# Patient Record
Sex: Female | Born: 1960 | Race: White | Hispanic: No | Marital: Married | Smoking: Never smoker
Health system: Southern US, Community
[De-identification: ages and names within clinical notes are randomized; demographics above are authoritative.]

## PROBLEM LIST (undated history)

## (undated) DIAGNOSIS — Z1211 Encounter for screening for malignant neoplasm of colon: Secondary | ICD-10-CM

## (undated) DIAGNOSIS — K635 Polyp of colon: Secondary | ICD-10-CM

## (undated) DIAGNOSIS — Z973 Presence of spectacles and contact lenses: Secondary | ICD-10-CM

## (undated) DIAGNOSIS — T8859XA Other complications of anesthesia, initial encounter: Secondary | ICD-10-CM

## (undated) DIAGNOSIS — R079 Chest pain, unspecified: Secondary | ICD-10-CM

## (undated) DIAGNOSIS — T4145XA Adverse effect of unspecified anesthetic, initial encounter: Secondary | ICD-10-CM

## (undated) DIAGNOSIS — T753XXA Motion sickness, initial encounter: Secondary | ICD-10-CM

## (undated) DIAGNOSIS — M419 Scoliosis, unspecified: Secondary | ICD-10-CM

## (undated) HISTORY — DX: Polyp of colon: K63.5

## (undated) HISTORY — PX: ADENOIDECTOMY: SUR15

## (undated) HISTORY — DX: Chest pain, unspecified: R07.9

## (undated) HISTORY — DX: Encounter for screening for malignant neoplasm of colon: Z12.11

## (undated) HISTORY — PX: SHOULDER ARTHROSCOPY: SHX128

## (undated) HISTORY — PX: BREAST BIOPSY: SHX20

---
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
## 2014-12-09 LAB — HM PAP SMEAR: HM PAP: NEGATIVE

## 2015-01-14 LAB — TSH: TSH: 3.23 (ref <=5.90)

## 2015-01-14 LAB — CBC AND DIFFERENTIAL
Hemoglobin: 15.4 (ref 12.0–16.0)
Platelets: 289 (ref 150–399)
WBC: 7.3

## 2015-04-22 ENCOUNTER — Other Ambulatory Visit: Payer: Self-pay | Admitting: Orthopedic Surgery

## 2015-04-22 DIAGNOSIS — M25512 Pain in left shoulder: Secondary | ICD-10-CM

## 2015-05-01 ENCOUNTER — Ambulatory Visit
Admission: RE | Admit: 2015-05-01 | Discharge: 2015-05-01 | Disposition: A | Discharge status: Home / Self Care | Payer: BLUE CROSS/BLUE SHIELD | Source: Ambulatory Visit | Attending: Orthopedic Surgery | Admitting: Orthopedic Surgery

## 2015-05-01 DIAGNOSIS — M25512 Pain in left shoulder: Secondary | ICD-10-CM

## 2016-09-01 ENCOUNTER — Emergency Department: Payer: BC Managed Care – PPO

## 2016-09-01 ENCOUNTER — Emergency Department
Admission: EM | Admit: 2016-09-01 | Discharge: 2016-09-01 | Disposition: A | Discharge status: Home / Self Care | Payer: BC Managed Care – PPO | Attending: Emergency Medicine | Admitting: Emergency Medicine

## 2016-09-01 DIAGNOSIS — R079 Chest pain, unspecified: Secondary | ICD-10-CM

## 2016-09-01 DIAGNOSIS — R112 Nausea with vomiting, unspecified: Secondary | ICD-10-CM | POA: Insufficient documentation

## 2016-09-01 DIAGNOSIS — R0789 Other chest pain: Secondary | ICD-10-CM | POA: Insufficient documentation

## 2016-09-01 LAB — CBC
HEMATOCRIT: 42.9 % (ref 35.0–47.0)
HEMOGLOBIN: 14.6 g/dL (ref 12.0–16.0)
MCH: 30.5 pg (ref 26.0–34.0)
MCHC: 34.1 g/dL (ref 32.0–36.0)
MCV: 89.5 fL (ref 80.0–100.0)
Platelets: 281 10*3/uL (ref 150–440)
RBC: 4.79 MIL/uL (ref 3.80–5.20)
RDW: 12.6 % (ref 11.5–14.5)
WBC: 7.3 10*3/uL (ref 3.6–11.0)

## 2016-09-01 LAB — BASIC METABOLIC PANEL
ANION GAP: 5 (ref 5–15)
BUN: 11 mg/dL (ref 6–20)
CALCIUM: 9.6 mg/dL (ref 8.9–10.3)
CHLORIDE: 107 mmol/L (ref 101–111)
CO2: 29 mmol/L (ref 22–32)
Creatinine, Ser: 0.65 mg/dL (ref 0.44–1.00)
GFR calc non Af Amer: >60 mL/min (ref >60)
Glucose, Bld: 98 mg/dL (ref 65–99)
POTASSIUM: 4 mmol/L (ref 3.5–5.1)
Sodium: 141 mmol/L (ref 135–145)

## 2016-09-01 LAB — TROPONIN I

## 2016-09-01 NOTE — ED Triage Notes (Signed)
Pt reports to ED w/ c/o 2 episodes of CP.  Pt reports 1st one yesterday, had episode of emesis associated w/ CP.  Pt A/Ox4, denies SOB or CP at this time.  Pt able to move all limbs w/o issue.  Pt denies fevers or LOC.  Pt sts that episode this AM.

## 2016-09-01 NOTE — ED Provider Notes (Signed)
[facility] Emergency Department Provider Note  Time seen: 2:32 PM  I have reviewed the triage vital signs and the nursing notes.   HISTORY  Chief Complaint Chest Pain    HPI Anna Hammond is a 56 y.o. female with no past medical history who presents to the emergency department with chest discomfort. According to the patient yesterday she was feeling somewhat nauseated and felt some discomfort which she describes as a tightness feeling in the left side of her chest had one episode of vomiting. She states after vomiting she felt much better. She had been symptom-free until this morning where she once again had a tightness sensation to left chest denies any nausea or shortness of breath or diaphoresis with this episode. 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 Her husband has a cardiac history and convinced her to come to the emergency department evaluated. Patient denies any symptoms at this time. Denies any chest discomfort shortness of breath nausea or diaphoresis. Denies any abdominal pain or diarrhea at any time. Overall the patient appears very well, no distress.  History reviewed. No pertinent past medical history.  There are no active problems to display for this patient.   History reviewed. No pertinent surgical history.  Prior to Admission medications   Not on File    Allergies  Allergen Reactions  . Macrobid [Nitrofurantoin Macrocrystal] Nausea Only    No family history on file.  Social History Social History  Substance Use Topics  . Smoking status: Never Smoker  . Smokeless tobacco: Never Used  . Alcohol use No    Review of Systems Constitutional: Negative for fever. Cardiovascular: Chest discomfort last night and this morning. Respiratory: Negative for shortness of breath. Gastrointestinal: Negative for abdominal pain. One episode of vomiting last night. Neurological:  Negative for headache 10-point ROS otherwise negative.  ____________________________________________   PHYSICAL EXAM:  VITAL SIGNS: ED Triage Vitals  Enc Vitals Group     BP 09/01/16 1141 132/77     Pulse Rate 09/01/16 1141 66     Resp 09/01/16 1141 18     Temp 09/01/16 1141 98.2 F (36.8 C)     Temp Source 09/01/16 1141 Oral     SpO2 09/01/16 1141 100 %     Weight 09/01/16 1141 125 lb (56.7 kg)     Height 09/01/16 1141 5\' 5"  (1.651 m)     Head Circumference --      Peak Flow --      Pain Score 09/01/16 1142 0     Pain Loc --      Pain Edu? --      Excl. in [place]? --     Constitutional: Alert and oriented. Well appearing and in no distress. Eyes: Normal exam ENT   Head: Normocephalic and atraumatic.   Mouth/Throat: Mucous membranes are moist. Cardiovascular: Normal rate, regular rhythm. No murmur Respiratory: Normal respiratory effort without tachypnea nor retractions. Breath sounds are clear Gastrointestinal: Soft and nontender. No distention.   Musculoskeletal: Nontender with normal range of motion in all extremities. No lower extremity tenderness or edema. Neurologic:  Normal speech and language. No gross focal neurologic deficits  Skin:  Skin is warm, dry and intact.  Psychiatric: Mood and affect are normal.   ____________________________________________    EKG  EKG reviewed and interpreted by myself shows normal sinus rhythm at 62 bpm, narrow QRS, normal axis, normal intervals, no ST  changes. Reassuring EKG.  ____________________________________________    RADIOLOGY  Chest x-ray negative  ____________________________________________   INITIAL IMPRESSION / ASSESSMENT AND PLAN / ED COURSE  Pertinent labs & imaging results that were available during my care of the patient were reviewed by me and considered in my medical decision making (see chart for details).  Patient presents to department with chest tightness/discomfort which has occurred twice.  Denies any symptoms at this time. Currently the patient EKG appears very normal, labs are normal including negative troponin. Chest x-ray is negative. Patient denies any personal or family cardiac history. I discussed with the patient a negative work up being very reassuring however she would still need a stress test. Patient is agreeable and will go to her husband's cardiologist to arrange this stress test. Provided by normal chest pain return precautions.  ____________________________________________   FINAL CLINICAL IMPRESSION(S) / ED DIAGNOSES  Chest pain    Harvest Dark, MD 09/01/16 1435

## 2016-09-01 NOTE — Discharge Instructions (Signed)
You have been seen in the emergency department today for chest pain. Your workup has shown normal results. As we discussed please follow-up with your primary care physician in the next 1-2 days for recheck. Return to the emergency department for any further chest pain, trouble breathing, or any other symptom personally concerning to yourself.  Please call the number provided for cardiology or see your husband's cardiologist to arrange a stress test as soon as possible.

## 2017-10-04 ENCOUNTER — Encounter: Payer: Self-pay | Admitting: Family Medicine

## 2017-10-18 ENCOUNTER — Ambulatory Visit: Payer: BC Managed Care – PPO | Admitting: Family Medicine

## 2017-10-18 ENCOUNTER — Encounter: Payer: Self-pay | Admitting: Family Medicine

## 2017-10-18 ENCOUNTER — Encounter: Payer: Self-pay | Admitting: Emergency Medicine

## 2017-10-18 VITALS — BP 116/78 | HR 72 | Temp 97.8°F | Resp 14 | Ht 65.0 in | Wt 128.0 lb

## 2017-10-18 DIAGNOSIS — Z114 Encounter for screening for human immunodeficiency virus [HIV]: Secondary | ICD-10-CM

## 2017-10-18 DIAGNOSIS — Z1159 Encounter for screening for other viral diseases: Secondary | ICD-10-CM | POA: Diagnosis not present

## 2017-10-18 DIAGNOSIS — Z0000 Encounter for general adult medical examination without abnormal findings: Secondary | ICD-10-CM | POA: Diagnosis not present

## 2017-10-18 DIAGNOSIS — Z1231 Encounter for screening mammogram for malignant neoplasm of breast: Secondary | ICD-10-CM | POA: Diagnosis not present

## 2017-10-18 DIAGNOSIS — Z1211 Encounter for screening for malignant neoplasm of colon: Secondary | ICD-10-CM

## 2017-10-18 DIAGNOSIS — Z1239 Encounter for other screening for malignant neoplasm of breast: Secondary | ICD-10-CM

## 2017-10-18 NOTE — Patient Instructions (Signed)
Preventive Care 40-64 Years, Female Preventive care refers to lifestyle choices and visits with your health care provider that can promote health and wellness. What does preventive care include?  A yearly physical exam. This is also called an annual well check.  Dental exams once or twice a year.  Routine eye exams. Ask your health care provider how often you should have your eyes checked.  Personal lifestyle choices, including: ? Daily care of your teeth and gums. ? Regular physical activity. ? Eating a healthy diet. ? Avoiding tobacco and drug use. ? Limiting alcohol use. ? Practicing safe sex. ? Taking low-dose aspirin daily starting at age 58. ? Taking vitamin and mineral supplements as recommended by your health care provider. What happens during an annual well check? The services and screenings done by your health care provider during your annual well check will depend on your age, overall health, lifestyle risk factors, and family history of disease. Counseling Your health care provider may ask you questions about your:  Alcohol use.  Tobacco use.  Drug use.  Emotional well-being.  Home and relationship well-being.  Sexual activity.  Eating habits.  Work and work Statistician.  Method of birth control.  Menstrual cycle.  Pregnancy history.  Screening You may have the following tests or measurements:  Height, weight, and BMI.  Blood pressure.  Lipid and cholesterol levels. These may be checked every 5 years, or more frequently if you are over 81 years old.  Skin check.  Lung cancer screening. You may have this screening every year starting at age 78 if you have a 30-pack-year history of smoking and currently smoke or have quit within the past 15 years.  Fecal occult blood test (FOBT) of the stool. You may have this test every year starting at age 65.  Flexible sigmoidoscopy or colonoscopy. You may have a sigmoidoscopy every 5 years or a colonoscopy  every 10 years starting at age 30.  Hepatitis C blood test.  Hepatitis B blood test.  Sexually transmitted disease (STD) testing.  Diabetes screening. This is done by checking your blood sugar (glucose) after you have not eaten for a while (fasting). You may have this done every 1-3 years.  Mammogram. This may be done every 1-2 years. Talk to your health care provider about when you should start having regular mammograms. This may depend on whether you have a family history of breast cancer.  BRCA-related cancer screening. This may be done if you have a family history of breast, ovarian, tubal, or peritoneal cancers.  Pelvic exam and Pap test. This may be done every 3 years starting at age 80. Starting at age 36, this may be done every 5 years if you have a Pap test in combination with an HPV test.  Bone density scan. This is done to screen for osteoporosis. You may have this scan if you are at high risk for osteoporosis.  Discuss your test results, treatment options, and if necessary, the need for more tests with your health care provider. Vaccines Your health care provider may recommend certain vaccines, such as:  Influenza vaccine. This is recommended every year.  Tetanus, diphtheria, and acellular pertussis (Tdap, Td) vaccine. You may need a Td booster every 10 years.  Varicella vaccine. You may need this if you have not been vaccinated.  Zoster vaccine. You may need this after age 5.  Measles, mumps, and rubella (MMR) vaccine. You may need at least one dose of MMR if you were born in  1957 or later. You may also need a second dose.  Pneumococcal 13-valent conjugate (PCV13) vaccine. You may need this if you have certain conditions and were not previously vaccinated.  Pneumococcal polysaccharide (PPSV23) vaccine. You may need one or two doses if you smoke cigarettes or if you have certain conditions.  Meningococcal vaccine. You may need this if you have certain  conditions.  Hepatitis A vaccine. You may need this if you have certain conditions or if you travel or work in places where you may be exposed to hepatitis A.  Hepatitis B vaccine. You may need this if you have certain conditions or if you travel or work in places where you may be exposed to hepatitis B.  Haemophilus influenzae type b (Hib) vaccine. You may need this if you have certain conditions.  Talk to your health care provider about which screenings and vaccines you need and how often you need them. This information is not intended to replace advice given to you by your health care provider. Make sure you discuss any questions you have with your health care provider. Document Released: 08/12/2015 Document Revised: 04/04/2016 Document Reviewed: 05/17/2015 Elsevier Interactive Patient Education  2018 Elsevier Inc.  

## 2017-10-18 NOTE — Progress Notes (Signed)
Patient: Anna Hammond Female    DOB: 18-Jun-1961   57 y.o.   MRN: [id number] Visit Date: 10/18/2017  Today's Provider: Lavon Paganini, MD   Chief Complaint  Patient presents with  . New Patient (Initial Visit)   Subjective:    HPI Pt is establishing care today. She reports that she is feeling well. Does not have any complaints. She was seeing her GYN he recently retired so she wanted to get a Primary care doctor. She reports that the last time she was seen by her previous doctor was about a year ago.  Pt is health and does not have any health problems.   Last pap was 12/16/14 - Reactive Cellular Changes associated with Inflammation, HPV negative Last mammogram 03/25/15 BIRADS 2  Per pt colonoscopy was about 10 years ago. No record of this in care everywhere.      Allergies  Allergen Reactions  . Macrobid [Nitrofurantoin Macrocrystal] Nausea Only  . Nitrofurantoin Nausea Only and Nausea And Vomiting    Current Outpatient Medications:  Marland Kitchen  Multiple Vitamin (MULTI-VITAMINS) TABS, Take by mouth., Disp: , Rfl:   Review of Systems  Constitutional: Negative.   HENT: Negative.   Eyes: Negative.   Respiratory: Negative.   Cardiovascular: Negative.   Gastrointestinal: Negative.   Endocrine: Negative.   Genitourinary: Negative.   Musculoskeletal: Negative.   Skin: Negative.   Allergic/Immunologic: Negative.   Neurological: Negative.   Hematological: Negative.   Psychiatric/Behavioral: Negative.    History reviewed. No pertinent past medical history.  Past Surgical History:  Procedure Laterality Date  . ADENOIDECTOMY    . SHOULDER ARTHROSCOPY Right     Social History   Tobacco Use  . Smoking status: Never Smoker  . Smokeless tobacco: Never Used  Substance Use Topics  . Alcohol use: Yes    Comment: once a month   Objective:   BP 116/78 (BP Location: Left Arm, Patient Position: Sitting)   Pulse 72   Temp 97.8 F (36.6 C) (Oral)   Resp 14   Ht 5\' 5"   (1.651 m)   Wt 128 lb (58.1 kg)   BMI 21.30 kg/m  Vitals:   10/18/17 1559  BP: 116/78  Pulse: 72  Resp: 14  Temp: 97.8 F (36.6 C)  TempSrc: Oral  Weight: 128 lb (58.1 kg)  Height: 5\' 5"  (1.651 m)     Physical Exam  Constitutional: She is oriented to person, place, and time. She appears well-developed and well-nourished. No distress.  HENT:  Head: Normocephalic and atraumatic.  Right Ear: External ear normal.  Left Ear: External ear normal.  Nose: Nose normal.  Mouth/Throat: Oropharynx is clear and moist.  Eyes: Pupils are equal, round, and reactive to light. Conjunctivae and EOM are normal. No scleral icterus.  Neck: Neck supple. No thyromegaly present.  Cardiovascular: Normal rate, regular rhythm, normal heart sounds and intact distal pulses.  No murmur heard. Pulmonary/Chest: Effort normal and breath sounds normal. No respiratory distress. She has no wheezes. She has no rales.  Abdominal: Soft. Bowel sounds are normal. She exhibits no distension. There is no tenderness. There is no rebound and no guarding.  Musculoskeletal: She exhibits no edema or deformity.  Lymphadenopathy:    She has no cervical adenopathy.  Neurological: She is alert and oriented to person, place, and time.  Skin: Skin is warm and dry. No rash noted.  Psychiatric: She has a normal mood and affect. Her behavior is normal.  Vitals reviewed.  Assessment & Plan:      Routine Health Maintenance and Physical Exam  Exercise Activities and Dietary recommendations Goals    None      There is no immunization history on file for this patient.   Health Maintenance  Topic Date Due  . Hepatitis C Screening  [birth year]  . HIV Screening  09/14/1975  . TETANUS/TDAP  09/14/1979  . PAP SMEAR  09/13/1990  . MAMMOGRAM  09/13/2010  . COLONOSCOPY  09/13/2010  . INFLUENZA VACCINE  02/27/2018 (Originally 02/27/2017)    Discussed health benefits of physical activity, and encouraged her  to engage in  regular exercise appropriate for her  age and condition.    -------------------------------------------------------------------- Problem List Items Addressed This Visit    None    Visit Diagnoses    Encounter for annual physical exam    -  Primary   Relevant Orders   Lipid panel   Comprehensive metabolic panel   Screening for breast cancer       Relevant Orders   MS DIGITAL SCREENING TOMO BILATERAL   Screen for colon cancer       Relevant Orders   Ambulatory referral to Gastroenterology   Need for hepatitis C screening test       Relevant Orders   Hepatitis C Antibody   Screening for HIV (human immunodeficiency virus)       Relevant Orders   HIV antibody (with reflex)      Return in about 1 year (around 10/19/2018) for physical.   The entirety of the information documented in the History of Present Illness, Review of Systems and Physical Exam were personally obtained by me. Portions of this information were initially documented by [place], [place] and reviewed by me for thoroughness and accuracy.    Virginia Crews, MD, MPH [facility] 10/18/2017 4:35 PM

## 2017-11-18 ENCOUNTER — Telehealth: Payer: Self-pay

## 2017-11-18 NOTE — Telephone Encounter (Signed)
Gastroenterology Pre-Procedure Review  Request Date: 01/14/18 Requesting Physician: Dr. Allen Norris  PATIENT REVIEW QUESTIONS: The patient responded to the following health history questions as indicated:    1. Are you having any GI issues? No  2. 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 Do you have a family history of Colon Cancer or Polyps? No  4. Diabetes Mellitus? No  5. Joint replacements in the past 12 months? No  6. Major health problems in the past 3 months? No  7. Any artificial heart valves, MVP, or defibrillator? No     MEDICATIONS & ALLERGIES:    Patient reports the following regarding taking any anticoagulation/antiplatelet therapy:   Plavix, Coumadin, Eliquis, Xarelto, Lovenox, Pradaxa, Brilinta, or Effient? No  Aspirin? No   Patient confirms/reports the following medications:  Current Outpatient Medications  Medication Sig Dispense Refill  . Multiple Vitamin (MULTI-VITAMINS) TABS Take by mouth.     No current facility-administered medications for this visit.     Patient confirms/reports the following allergies:  Allergies  Allergen Reactions  . Macrobid [Nitrofurantoin Macrocrystal] Nausea Only  . Nitrofurantoin Nausea Only and Nausea And Vomiting    No orders of the defined types were placed in this encounter.   AUTHORIZATION INFORMATION Primary Insurance: 1D#: Group #:  Secondary Insurance: 1D#: Group #:  SCHEDULE INFORMATION: Date: 01/14/18 Time: Location: [facility]

## 2017-11-20 ENCOUNTER — Telehealth: Payer: Self-pay

## 2017-11-20 LAB — LIPID PANEL
Chol/HDL Ratio: 2.7 ratio (ref 0.0–4.4)
Cholesterol, Total: 187 mg/dL (ref 100–199)
HDL: 70 mg/dL (ref >39)
LDL CALC: 96 mg/dL (ref 0–99)
Triglycerides: 103 mg/dL (ref 0–149)
VLDL CHOLESTEROL CAL: 21 mg/dL (ref 5–40)

## 2017-11-20 LAB — COMPREHENSIVE METABOLIC PANEL
ALK PHOS: 59 IU/L (ref 39–117)
ALT: 13 IU/L (ref 0–32)
AST: 17 IU/L (ref 0–40)
Albumin/Globulin Ratio: 2 (ref 1.2–2.2)
Albumin: 4.7 g/dL (ref 3.5–5.5)
BUN/Creatinine Ratio: 14 (ref 9–23)
BUN: 10 mg/dL (ref 6–24)
Bilirubin Total: 1.5 mg/dL — ABNORMAL HIGH (ref 0.0–1.2)
CO2: 26 mmol/L (ref 20–29)
CREATININE: 0.73 mg/dL (ref 0.57–1.00)
Calcium: 9.7 mg/dL (ref 8.7–10.2)
Chloride: 101 mmol/L (ref 96–106)
GFR calc Af Amer: 106 mL/min/{1.73_m2} (ref >59)
GFR calc non Af Amer: 92 mL/min/{1.73_m2} (ref >59)
GLUCOSE: 77 mg/dL (ref 65–99)
Globulin, Total: 2.3 g/dL (ref 1.5–4.5)
Potassium: 3.8 mmol/L (ref 3.5–5.2)
Sodium: 140 mmol/L (ref 134–144)
Total Protein: 7 g/dL (ref 6.0–8.5)

## 2017-11-20 LAB — HIV ANTIBODY (ROUTINE TESTING W REFLEX): HIV SCREEN 4TH GENERATION: NONREACTIVE

## 2017-11-20 LAB — HEPATITIS C ANTIBODY

## 2017-11-20 NOTE — Telephone Encounter (Signed)
Pt advised.

## 2017-11-20 NOTE — Telephone Encounter (Signed)
-----   Message from Virginia Crews, MD sent at 11/20/2017  8:25 AM EDT ----- Normal cholesterol, blood sugar, kidney function, liver function, electrolytes.  Bilirubin is slightly elevated, so we will recheck this with next set of labs.  Negative hepatitis C and HIV screening.  Virginia Crews, MD, MPH [facility] 11/20/2017 8:25 AM

## 2017-11-21 ENCOUNTER — Other Ambulatory Visit: Payer: Self-pay

## 2017-11-21 ENCOUNTER — Encounter: Payer: Self-pay | Admitting: Gastroenterology

## 2017-11-21 DIAGNOSIS — Z1211 Encounter for screening for malignant neoplasm of colon: Secondary | ICD-10-CM

## 2017-11-27 ENCOUNTER — Ambulatory Visit
Admission: RE | Admit: 2017-11-27 | Discharge: 2017-11-27 | Disposition: A | Discharge status: Home / Self Care | Payer: BC Managed Care – PPO | Source: Ambulatory Visit | Attending: Family Medicine | Admitting: Family Medicine

## 2017-11-27 DIAGNOSIS — Z1231 Encounter for screening mammogram for malignant neoplasm of breast: Secondary | ICD-10-CM | POA: Insufficient documentation

## 2017-11-27 DIAGNOSIS — R928 Other abnormal and inconclusive findings on diagnostic imaging of breast: Secondary | ICD-10-CM | POA: Insufficient documentation

## 2017-11-27 DIAGNOSIS — Z1239 Encounter for other screening for malignant neoplasm of breast: Secondary | ICD-10-CM

## 2017-11-28 ENCOUNTER — Other Ambulatory Visit: Payer: Self-pay | Admitting: Family Medicine

## 2017-11-28 DIAGNOSIS — R928 Other abnormal and inconclusive findings on diagnostic imaging of breast: Secondary | ICD-10-CM

## 2017-11-28 DIAGNOSIS — N6489 Other specified disorders of breast: Secondary | ICD-10-CM

## 2017-11-28 DIAGNOSIS — R921 Mammographic calcification found on diagnostic imaging of breast: Secondary | ICD-10-CM

## 2017-12-16 ENCOUNTER — Ambulatory Visit
Admission: RE | Admit: 2017-12-16 | Discharge: 2017-12-16 | Disposition: A | Discharge status: Home / Self Care | Payer: BC Managed Care – PPO | Source: Ambulatory Visit | Attending: Family Medicine | Admitting: Family Medicine

## 2017-12-16 DIAGNOSIS — N6489 Other specified disorders of breast: Secondary | ICD-10-CM

## 2017-12-16 DIAGNOSIS — R928 Other abnormal and inconclusive findings on diagnostic imaging of breast: Secondary | ICD-10-CM

## 2017-12-16 DIAGNOSIS — R921 Mammographic calcification found on diagnostic imaging of breast: Secondary | ICD-10-CM

## 2017-12-17 ENCOUNTER — Telehealth: Payer: Self-pay

## 2017-12-17 NOTE — Telephone Encounter (Signed)
lmtcb

## 2017-12-17 NOTE — Telephone Encounter (Signed)
-----   Message from Virginia Crews, MD sent at 12/16/2017  4:46 PM EDT ----- Benign mammogram.  Repeat in 1 yr.  Virginia Crews, MD, MPH [facility] 12/16/2017 4:45 PM

## 2017-12-17 NOTE — Telephone Encounter (Signed)
Pt returned call

## 2017-12-24 NOTE — Telephone Encounter (Signed)
lmtcb

## 2017-12-24 NOTE — Telephone Encounter (Signed)
Pt advised.

## 2018-01-06 ENCOUNTER — Telehealth: Payer: Self-pay | Admitting: Gastroenterology

## 2018-01-06 NOTE — Telephone Encounter (Signed)
Pt left vm  She is scheduled for procedure 6/18 and would like to reschedule it for August

## 2018-01-07 NOTE — Telephone Encounter (Signed)
LVM for pt to return my call.

## 2018-01-13 ENCOUNTER — Encounter: Payer: Self-pay | Admitting: Student

## 2018-01-13 NOTE — Telephone Encounter (Signed)
Left vm again for pt to return my call to schedule.

## 2018-01-14 ENCOUNTER — Encounter: Payer: Self-pay | Admitting: Anesthesiology

## 2018-01-14 ENCOUNTER — Encounter: Admission: RE | Payer: Self-pay | Source: Ambulatory Visit

## 2018-01-14 SURGERY — COLONOSCOPY WITH PROPOFOL
Anesthesia: General

## 2018-01-15 ENCOUNTER — Ambulatory Visit
Admission: RE | Admit: 2018-01-15 | Payer: BC Managed Care – PPO | Source: Ambulatory Visit | Admitting: Gastroenterology

## 2018-04-17 ENCOUNTER — Ambulatory Visit: Payer: BC Managed Care – PPO | Admitting: Physician Assistant

## 2018-04-17 ENCOUNTER — Encounter: Payer: Self-pay | Admitting: Physician Assistant

## 2018-04-17 VITALS — BP 110/80 | HR 71 | Resp 16 | Wt 125.2 lb

## 2018-04-17 DIAGNOSIS — T63301A Toxic effect of unspecified spider venom, accidental (unintentional), initial encounter: Secondary | ICD-10-CM

## 2018-04-17 MED ORDER — DOXYCYCLINE HYCLATE 100 MG PO TABS: 100.0000 mg | ORAL_TABLET | Freq: Two times a day (BID) | ORAL | 0 refills | Status: DC

## 2018-04-17 NOTE — Progress Notes (Signed)
       Patient: Anna Hammond Female    DOB: 03/13/1961   57 y.o.   MRN: [id number] Visit Date: 04/17/2018  Today's Provider: Mar Daring, PA-C   Chief Complaint  Patient presents with  . Bite   Subjective:    HPI Patient here today with c/o blister on right ankle. Patient reports that yesterday she was walking into school and got bit by something. Reports it started to itched and when she got home she pop it and some yellowish fluid came out. She reports that she applied antibiotic cream.Patient reports that it was itching again this morning and she had a blister again. She reports that she has been using cortisone cream, antibiotic cream, and she took some Benadryl earlier this morning.      Allergies  Allergen Reactions  . Macrobid [Nitrofurantoin Macrocrystal] Nausea Only  . Nitrofurantoin Nausea Only and Nausea And Vomiting     Current Outpatient Medications:  Marland Kitchen  Multiple Vitamin (MULTI-VITAMINS) TABS, Take by mouth., Disp: , Rfl:   Review of Systems  Constitutional: Negative.   Respiratory: Negative.   Cardiovascular: Negative.   Skin: Positive for color change and wound.  Neurological: Negative.     Social History   Tobacco Use  . Smoking status: Never Smoker  . Smokeless tobacco: Never Used  Substance Use Topics  . Alcohol use: Yes    Comment: once a month   Objective:   BP 110/80 (BP Location: Left Arm, Patient Position: Sitting, Cuff Size: Normal)   Pulse 71   Resp 16   Wt 125 lb 3.2 oz (56.8 kg)   SpO2 97%   BMI 20.83 kg/m  Vitals:   04/17/18 1550  BP: 110/80  Pulse: 71  Resp: 16  SpO2: 97%  Weight: 125 lb 3.2 oz (56.8 kg)     Physical Exam  Constitutional: She appears well-developed and well-nourished.  HENT:  Head: Normocephalic and atraumatic.  Eyes: EOM are normal.  Neck: Normal range of motion. Neck supple.  Pulmonary/Chest: Effort normal. No respiratory distress.  Skin: Rash noted. Rash is vesicular (clear fluid in large  vesicle just distal to right medial malleolus with surrounding erythema ).  Psychiatric: She has a normal mood and affect. Her behavior is normal. Judgment and thought content normal.  Vitals reviewed.       Assessment & Plan:     1. Spider bite wound, accidental or unintentional, initial encounter Highly suspicious for a brown recluse spider bite due to appearance. Will start doxycycline as below. Wound culture also obtained to make sure not anything else. Continue benadryl for itching. Call if symptoms worsen. I will see her back on Monday to recheck.  - doxycycline (VIBRA-TABS) 100 MG tablet; Take 1 tablet (100 mg total) by mouth 2 (two) times daily.  Dispense: 20 tablet; Refill: 0 - Aerobic culture       Mar Daring, PA-C  [facility]

## 2018-04-17 NOTE — Patient Instructions (Signed)
Spider Bite Spider bites are not common. Most spider bites do not cause serious problems. There are only a few types of spider bites that can cause serious health problems. Follow these instructions at home: Medicine  Take or apply over-the-counter and prescription medicines only as told by your doctor.  If you were given an antibiotic medicine, take or apply it as told by your doctor. Do not stop using the antibiotic even if your condition improves. General instructions  Do not scratch the bite area.  Keep the bite area clean and dry. Wash the bite area with soap and water every day as told by your doctor.  If directed, apply ice to the bite area. ? Put ice in a plastic bag. ? Place a towel between your skin and the bag. ? Leave the ice on for 20 minutes, 2-3 times per day.  Raise (elevate) the affected area above the level of your heart while you are sitting or lying down, if this is possible.  Keep all follow-up visits as told by your doctor. This is important. Contact a doctor if:  Your bite does not get better after 3 days.  Your bite turns black or purple.  Near the bite, you have: ? Redness. ? Swelling (inflammation). ? Pain that is getting worse. Get help right away if:  You get shortness of breath or chest pain.  You have fluid, blood, or pus coming from the bite area.  You have muscle cramps or painful muscle spasms.  You have stomach (abdominal) pain.  You feel sick to your stomach (nauseous) or you throw up (vomit).  You feel more tired or sleepy than you normally do. This information is not intended to replace advice given to you by your health care provider. Make sure you discuss any questions you have with your health care provider. Document Released: 08/18/2010 Document Revised: 03/12/2016 Document Reviewed: 12/01/2014 Elsevier Interactive Patient Education  2018 Elsevier Inc.  

## 2018-04-21 ENCOUNTER — Telehealth: Payer: Self-pay

## 2018-04-21 ENCOUNTER — Ambulatory Visit: Payer: Self-pay | Admitting: Physician Assistant

## 2018-04-21 LAB — AEROBIC CULTURE

## 2018-04-21 NOTE — Telephone Encounter (Signed)
Patient advised as below.  

## 2018-04-21 NOTE — Telephone Encounter (Signed)
Pt called back for lab results.  Please call pt back disclose results.  Thanks,  [employer]

## 2018-04-21 NOTE — Telephone Encounter (Signed)
lmtcb

## 2018-04-21 NOTE — Telephone Encounter (Signed)
-----   Message from Mar Daring, [place] sent at 04/21/2018 10:59 AM EDT ----- Culture was negative for any bacterial growth

## 2018-05-26 ENCOUNTER — Ambulatory Visit: Payer: BC Managed Care – PPO | Admitting: Physician Assistant

## 2018-05-26 ENCOUNTER — Encounter: Payer: Self-pay | Admitting: Physician Assistant

## 2018-05-26 VITALS — BP 100/60 | HR 66 | Temp 98.6°F | Resp 16 | Wt 128.8 lb

## 2018-05-26 DIAGNOSIS — S61212A Laceration without foreign body of right middle finger without damage to nail, initial encounter: Secondary | ICD-10-CM

## 2018-05-26 DIAGNOSIS — Z23 Encounter for immunization: Secondary | ICD-10-CM | POA: Diagnosis not present

## 2018-05-26 NOTE — Patient Instructions (Addendum)

## 2018-05-26 NOTE — Progress Notes (Signed)
       Patient: Anna Hammond Female    DOB: [birth year]   57 y.o.   MRN: [id number] Visit Date: 05/26/2018  Today's Provider: Trinna Post, PA-C   Chief Complaint  Patient presents with  . Laceration   Subjective:    HPI Patient here today with a cut from a can she was rinsing on her right middle finger on Thursday night, 05/22/2018. Patient reports that she did not go to ER or Urgent Care. Patient reports that the cut is from her knuckle to the finger pad. She denies loss of motion and sensation. Patient has been using OTC neosporin. Patient denies any redness or abnormal discharge. Last tetanus shot was 2012.     Allergies  Allergen Reactions  . Macrobid [Nitrofurantoin Macrocrystal] Nausea Only  . Nitrofurantoin Nausea Only and Nausea And Vomiting     Current Outpatient Medications:  Marland Kitchen  Multiple Vitamin (MULTI-VITAMINS) TABS, Take by mouth., Disp: , Rfl:  .  doxycycline (VIBRA-TABS) 100 MG tablet, Take 1 tablet (100 mg total) by mouth 2 (two) times daily. (Patient not taking: Reported on 05/26/2018), Disp: 20 tablet, Rfl: 0  Review of Systems  Constitutional: Negative.   Respiratory: Negative.   Skin:       Cut on right middle finger    Social History   Tobacco Use  . Smoking status: Never Smoker  . Smokeless tobacco: Never Used  Substance Use Topics  . Alcohol use: Yes    Comment: once a month   Objective:   BP 100/60 (BP Location: Left Arm, Patient Position: Sitting, Cuff Size: Normal)   Pulse 66   Temp 98.6 F (37 C) (Oral)   Resp 16   Wt 128 lb 12.8 oz (58.4 kg)   SpO2 99%   BMI 21.43 kg/m  Vitals:   05/26/18 1530  BP: 100/60  Pulse: 66  Resp: 16  Temp: 98.6 F (37 C)  TempSrc: Oral  SpO2: 99%  Weight: 128 lb 12.8 oz (58.4 kg)     Physical Exam  Constitutional: She is oriented to person, place, and time. She appears well-developed and well-nourished.  Cardiovascular: Normal rate.  Pulmonary/Chest: Effort normal.  Musculoskeletal:         Hands: Right middle finger has 3 cm linear laceration extending from medial aspect of the PIP to the distal end of the finer. Area is well approximated though not completely closed. No surrounding erythema or purulent discharge.   Neurological: She is alert and oriented to person, place, and time.  Skin: Skin is warm and dry.  Psychiatric: She has a normal mood and affect. Her behavior is normal.        Assessment & Plan:     1. Laceration of right middle finger, foreign body presence unspecified, nail damage status unspecified, initial encounter  Update tetanus today. Counseled on keeping wound clean and signs of infection that need further attention.   - Td vaccine greater than or equal to 7yo preservative free IM  Return if symptoms worsen or fail to improve.  The entirety of the information documented in the History of Present Illness, Review of Systems and Physical Exam were personally obtained by me. Portions of this information were initially documented by Lynford Humphrey, CMA and reviewed by me for thoroughness and accuracy.         Trinna Post, PA-C  [facility]

## 2018-10-24 ENCOUNTER — Encounter: Payer: Self-pay | Admitting: Family Medicine

## 2019-05-22 ENCOUNTER — Other Ambulatory Visit: Payer: Self-pay

## 2019-05-22 ENCOUNTER — Ambulatory Visit: Payer: BC Managed Care – PPO | Admitting: Physician Assistant

## 2019-05-22 ENCOUNTER — Encounter: Payer: Self-pay | Admitting: Physician Assistant

## 2019-05-22 VITALS — BP 110/75 | HR 67 | Temp 97.1°F | Resp 16 | Ht 66.0 in | Wt 136.0 lb

## 2019-05-22 DIAGNOSIS — M25531 Pain in right wrist: Secondary | ICD-10-CM

## 2019-05-22 DIAGNOSIS — M79641 Pain in right hand: Secondary | ICD-10-CM | POA: Diagnosis not present

## 2019-05-22 NOTE — Patient Instructions (Signed)
Hand Pain Many things can cause hand pain. Some common causes are:  An injury.  Repeating the same movement with your hand over and over (overuse).  Osteoporosis.  Arthritis.  Lumps in the tendons or joints of the hand and wrist (ganglion cysts).  Nerve compression syndromes (carpal tunnel syndrome).  Inflammation of the tendons (tendinitis).  Infection. Follow these instructions at home: Pay attention to any changes in your symptoms. Take these actions to help with your discomfort: Managing pain, stiffness, and swelling   Take over-the-counter and prescription medicines only as told by your health care provider.  Wear a hand splint or support as told by your health care provider.  If directed, put ice on the affected area: ? Put ice in a plastic bag. ? Place a towel between your skin and the bag. ? Leave the ice on for 20 minutes, 2-3 times a day. Activity  Take breaks from repetitive activity often.  Avoid activities that make your pain worse.  Minimize stress on your hands and wrists as much as possible.  Do stretches or exercises as told by your health care provider.  Do not do activities that make your pain worse. Contact a health care provider if:  Your pain does not get better after a few days of self-care.  Your pain gets worse.  Your pain affects your ability to do your daily activities. Get help right away if:  Your hand becomes warm, red, or swollen.  Your hand is numb or tingling.  Your hand is extremely swollen or deformed.  Your hand or fingers turn white or blue.  You cannot move your hand, wrist, or fingers. Summary  Many things can cause hand pain.  Contact your health care provider if your pain does not get better after a few days of self care.  Minimize stress on your hands and wrists as much as possible.  Do not do activities that make your pain worse. This information is not intended to replace advice given to you by your  health care provider. Make sure you discuss any questions you have with your health care provider. Document Released: 08/12/2015 Document Revised: 04/11/2018 Document Reviewed: 04/11/2018 Elsevier Patient Education  2020 Elsevier Inc.  

## 2019-05-22 NOTE — Progress Notes (Signed)
       Patient: Anna Hammond Female    DOB: [birth year]   58 y.o.   MRN: [id number] Visit Date: 05/22/2019  Today's Provider: Trinna Post, PA-C   Chief Complaint  Patient presents with  . Hand Injury   Subjective:     HPI Patient here today c/o right hand injury, pt reports she fell yesterday. Patient reports she tripped over a tree root at [facility]. She fell onto her outstretched hand. Patient reports pain, swelling and redness around right hand and wrist. Reports difficulty gripping with her right hand. Patient reports take Ibuprofen last night. Patient has not taken any today.    Allergies  Allergen Reactions  . Macrobid [Nitrofurantoin Macrocrystal] Nausea Only  . Nitrofurantoin Nausea Only and Nausea And Vomiting     Current Outpatient Medications:  Marland Kitchen  Multiple Vitamin (MULTI-VITAMINS) TABS, Take by mouth., Disp: , Rfl:   Review of Systems  Constitutional: Negative.   Cardiovascular: Negative.   Musculoskeletal: Positive for myalgias.    Social History   Tobacco Use  . Smoking status: Never Smoker  . Smokeless tobacco: Never Used  Substance Use Topics  . Alcohol use: Yes    Comment: once a month      Objective:   BP 110/75 (BP Location: Left Arm, Patient Position: Sitting, Cuff Size: Normal)   Pulse 67   Temp (!) 97.1 F (36.2 C) (Temporal)   Resp 16   Ht 5\' 6"  (1.676 m)   Wt 136 lb (61.7 kg)   BMI 21.95 kg/m  Vitals:   05/22/19 1124  BP: 110/75  Pulse: 67  Resp: 16  Temp: (!) 97.1 F (36.2 C)  TempSrc: Temporal  Weight: 136 lb (61.7 kg)  Height: 5\' 6"  (1.676 m)  Body mass index is 21.95 kg/m.   Physical Exam Constitutional:      Appearance: Normal appearance.  Musculoskeletal:       Hands:  Neurological:     Mental Status: She is alert.      No results found for any visits on 05/22/19.     Assessment & Plan    1. Right hand pain  Suspect her hand is broken. Explained to patient that I can order xray with out  facility but if it's broken, we do not have ability to cast here and we would refer to [facility]. Also explained that she can go to [facility] walk in clinic and I have placed referral for this. She would like to go to the [facility]. She is taking ibuprofen for pain and is comfortable with this.   - DG Wrist Complete Right; Future - DG Hand Complete Right; Future - Ambulatory referral to Orthopedics  2. Right wrist pain  - DG Wrist Complete Right; Future - DG Hand Complete Right; Future - Ambulatory referral to Orthopedics  The entirety of the information documented in the History of Present Illness, Review of Systems and Physical Exam were personally obtained by me. Portions of this information were initially documented by Lynford Humphrey, CMA and reviewed by me for thoroughness and accuracy.   F/u PRN    Trinna Post, PA-C  [facility]

## 2019-08-18 ENCOUNTER — Ambulatory Visit: Payer: BC Managed Care – PPO | Attending: Internal Medicine

## 2019-08-18 DIAGNOSIS — Z20822 Contact with and (suspected) exposure to covid-19: Secondary | ICD-10-CM

## 2019-08-18 NOTE — Progress Notes (Signed)
Patient: Anna Hammond Female    DOB: Jan 21, 1961   59 y.o.   MRN: [id number] Visit Date: 08/18/2019  Today's Provider: Lavon Paganini, MD   Chief Complaint  Patient presents with  . Adenopathy   Subjective:    Virtual Visit via Video Note  I connected with [name] on 08/18/19 at  8:40 AM EST by a video enabled telemedicine application and verified that I am speaking with the correct person using two identifiers.  Location: Patient location: home Provider location: [facility] Persons involved in the visit: patient, provider    I discussed the limitations of evaluation and management by telemedicine and the availability of in person appointments. The patient expressed understanding and agreed to proceed.     HPI Patient C/O swollen lymph nodes around neck area x's 2 weeks. Patient reports fatigue, and ear pressure. Patient reports she will have COVID-19 test done tomorrow. Patient denies any fever, chills, cough or sore throat. Patient reports taking OTC medications and reports some symptom control.   12/8 had a stressful phone call at work and started to have chest pain.  By the time that she got home, chest pain was radiating to back and she was having pain with every deep breath  Lasted ~24 hours.  The next day laid down and closed her eyes and heard a boom and saw flashing lights.  Chest pain resolved.  No headache.  Late December/early January, noticed L neck lymph nodes were swollen and urine had strong odor.  No other UTI symptoms (Dysuria, frequency/urgency, hematuria).   Drinking more water and immune building tea. Week of Jan 4th, lymph nodes continued to get larger and whole L side of neck was swollen.  1/17 woke up and felt like she had run a marathon as she was so exhausted.  She put hot compresses on lymph nodes and they have gone down almost entirely. She works in a school.  She had a COVID test yesterday and does not have results  yet.   Allergies  Allergen Reactions  . Macrobid [Nitrofurantoin Macrocrystal] Nausea Only  . Nitrofurantoin Nausea Only and Nausea And Vomiting     Current Outpatient Medications:  Marland Kitchen  Multiple Vitamin (MULTI-VITAMINS) TABS, Take by mouth., Disp: , Rfl:   Review of Systems  Constitutional: Positive for fatigue. Negative for chills and fever.  HENT: Positive for ear pain. Negative for sore throat and trouble swallowing.   Respiratory: Negative.  Negative for cough, shortness of breath and wheezing.   Cardiovascular: Negative for chest pain and palpitations.    Social History   Tobacco Use  . Smoking status: Never Smoker  . Smokeless tobacco: Never Used  Substance Use Topics  . Alcohol use: Yes    Comment: once a month      Objective:   There were no vitals taken for this visit. There were no vitals filed for this visit.There is no height or weight on file to calculate BMI.   Physical Exam Constitutional:      General: She is not in acute distress.    Appearance: Normal appearance. She is not diaphoretic.  HENT:     Head: Normocephalic and atraumatic.  Eyes:     General: No scleral icterus.    Conjunctiva/sclera: Conjunctivae normal.  Neck:     Comments: No visible neck swelling or asymmetry Pulmonary:     Effort: Pulmonary effort is normal. No respiratory distress.  Neurological:     Mental Status:  She is alert and oriented to person, place, and time. Mental status is at baseline.      No results found for any visits on 08/19/19.     Assessment & Plan    I discussed the assessment and treatment plan with the patient. The patient was provided an opportunity to ask questions and all were answered. The patient agreed with the plan and demonstrated an understanding of the instructions.   The patient was advised to call back or seek an in-person evaluation if the symptoms worsen or if the condition fails to improve as anticipated.   1. Lymphadenopathy - new  problem x1 wk that is now improving - likely reactive in nature - discussed ongoing watchful waiting - if it persists or worsens, would consider checking labs and [place]  2. Fatigue, unspecified type - fatigue and lymphadenopathy concerning for possible viral infection - discussed that COVID-19 is currently the most common viral infection that people have -Glad that she already got a COVID-19 test, we will await results -While awaiting results, presume that she is positive and recommended 10-day self-isolation from start of symptoms and until fever free for at least 24 hours -Discussed natural course, symptomatic management, and return precautions  3. Chest pain, unspecified type -New problem that occurred once more than a month ago -Her description of the crushing chest pain that radiated to her back and was associated with shortness of breath and diaphoresis is concerning for a possible cardiac etiology -Given that it was remote and not recent, she does not need to be seen emergently, but we did discuss the importance of her being evaluated by cardiology, possibly with stress testing, to evaluate her future risk -Discussed ER precautions if she has a recurrence of this and discussed worrisome symptoms - Ambulatory referral to Cardiology  4. Screen for colon cancer - colonoscopy had to be rescheduled and seems to have fallen off - she is due for colonoscopy - referral placed today - Ambulatory referral to Gastroenterology   Follow-up as needed   The entirety of the information documented in the History of Present Illness, Review of Systems and Physical Exam were personally obtained by me. Portions of this information were initially documented by Lynford Humphrey, CMA and reviewed by me for thoroughness and accuracy.    Rodricus Candelaria, Dionne Bucy, MD MPH [facility]

## 2019-08-19 ENCOUNTER — Other Ambulatory Visit: Payer: Self-pay

## 2019-08-19 ENCOUNTER — Telehealth: Payer: BC Managed Care – PPO | Admitting: Family Medicine

## 2019-08-19 ENCOUNTER — Telehealth (INDEPENDENT_AMBULATORY_CARE_PROVIDER_SITE_OTHER): Payer: BC Managed Care – PPO | Admitting: Family Medicine

## 2019-08-19 DIAGNOSIS — R5383 Other fatigue: Secondary | ICD-10-CM

## 2019-08-19 DIAGNOSIS — Z1211 Encounter for screening for malignant neoplasm of colon: Secondary | ICD-10-CM

## 2019-08-19 DIAGNOSIS — R079 Chest pain, unspecified: Secondary | ICD-10-CM | POA: Diagnosis not present

## 2019-08-19 DIAGNOSIS — R591 Generalized enlarged lymph nodes: Secondary | ICD-10-CM | POA: Diagnosis not present

## 2019-08-19 LAB — NOVEL CORONAVIRUS, NAA: SARS-CoV-2, NAA: NOT DETECTED

## 2019-08-19 NOTE — Patient Instructions (Signed)

## 2019-09-16 ENCOUNTER — Encounter: Payer: Self-pay | Admitting: Gastroenterology

## 2019-09-16 ENCOUNTER — Other Ambulatory Visit: Payer: Self-pay

## 2019-09-18 ENCOUNTER — Other Ambulatory Visit
Admission: RE | Admit: 2019-09-18 | Discharge: 2019-09-18 | Disposition: A | Discharge status: Home / Self Care | Payer: BC Managed Care – PPO | Source: Ambulatory Visit | Attending: Gastroenterology | Admitting: Gastroenterology

## 2019-09-18 ENCOUNTER — Telehealth: Payer: Self-pay

## 2019-09-18 DIAGNOSIS — Z01812 Encounter for preprocedural laboratory examination: Secondary | ICD-10-CM | POA: Diagnosis not present

## 2019-09-18 DIAGNOSIS — Z20822 Contact with and (suspected) exposure to covid-19: Secondary | ICD-10-CM | POA: Insufficient documentation

## 2019-09-18 NOTE — Telephone Encounter (Signed)
FYI....    Copied from [place] [id number]. Topic: General - Call Back - No Documentation >> Sep 18, 2019  3:39 PM Erick Blinks wrote: Reason for CRM: Pt is scheduled for a colonoscopy and needs a PA before she can receive her pre surgery solution from [facility]. Please advise, Pt has been advised to follow up with GI Dr. As well Best contact: [phone]

## 2019-09-18 NOTE — Telephone Encounter (Signed)
GI who prescribed it will need to complete PA

## 2019-09-19 LAB — SARS CORONAVIRUS 2 (TAT 6-24 HRS): SARS Coronavirus 2: NEGATIVE

## 2019-09-21 ENCOUNTER — Telehealth: Payer: Self-pay | Admitting: Gastroenterology

## 2019-09-21 NOTE — Discharge Instructions (Signed)
General Anesthesia, Adult, Care After This sheet gives you information about how to care for yourself after your procedure. Your health care provider may also give you more specific instructions. If you have problems or questions, contact your health care provider. What can I expect after the procedure? After the procedure, the following side effects are common:  Pain or discomfort at the IV site.  Nausea.  Vomiting.  Sore throat.  Trouble concentrating.  Feeling cold or chills.  Weak or tired.  Sleepiness and fatigue.  Soreness and body aches. These side effects can affect parts of the body that were not involved in surgery. Follow these instructions at home:  For at least 24 hours after the procedure:  Have a responsible adult stay with you. It is important to have someone help care for you until you are awake and alert.  Rest as needed.  Do not: ? Participate in activities in which you could fall or become injured. ? Drive. ? Use heavy machinery. ? Drink alcohol. ? Take sleeping pills or medicines that cause drowsiness. ? Make important decisions or sign legal documents. ? Take care of children on your own. Eating and drinking  Follow any instructions from your health care provider about eating or drinking restrictions.  When you feel hungry, start by eating small amounts of foods that are soft and easy to digest (bland), such as toast. Gradually return to your regular diet.  Drink enough fluid to keep your urine pale yellow.  If you vomit, rehydrate by drinking water, juice, or clear broth. General instructions  If you have sleep apnea, surgery and certain medicines can increase your risk for breathing problems. Follow instructions from your health care provider about wearing your sleep device: ? Anytime you are sleeping, including during daytime naps. ? While taking prescription pain medicines, sleeping medicines, or medicines that make you drowsy.  Return to  your normal activities as told by your health care provider. Ask your health care provider what activities are safe for you.  Take over-the-counter and prescription medicines only as told by your health care provider.  If you smoke, do not smoke without supervision.  Keep all follow-up visits as told by your health care provider. This is important. Contact a health care provider if:  You have nausea or vomiting that does not get better with medicine.  You cannot eat or drink without vomiting.  You have pain that does not get better with medicine.  You are unable to pass urine.  You develop a skin rash.  You have a fever.  You have redness around your IV site that gets worse. Get help right away if:  You have difficulty breathing.  You have chest pain.  You have blood in your urine or stool, or you vomit blood. Summary  After the procedure, it is common to have a sore throat or nausea. It is also common to feel tired.  Have a responsible adult stay with you for the first 24 hours after general anesthesia. It is important to have someone help care for you until you are awake and alert.  When you feel hungry, start by eating small amounts of foods that are soft and easy to digest (bland), such as toast. Gradually return to your regular diet.  Drink enough fluid to keep your urine pale yellow.  Return to your normal activities as told by your health care provider. Ask your health care provider what activities are safe for you. This information is not   intended to replace advice given to you by your health care provider. Make sure you discuss any questions you have with your health care provider. Document Revised: 07/19/2017 Document Reviewed: 03/01/2017 Elsevier Patient Education  2020 Elsevier Inc.  

## 2019-09-21 NOTE — Telephone Encounter (Signed)
Patient called & l/m on v/m stating she has a colonoscopy on Tuesday 09-22-19 & does not have her prep. She states she went to [facility] to get prep & they stated it need prior approval so currently she doe not have any prep.

## 2019-09-21 NOTE — Telephone Encounter (Signed)
Called patients pharmacy to change SuPrep Rx for bowel prep covered by her insurance.  Bowel prep has been changed to a PEG bowel prep.  Advised patient that her bowel prep has been changed, and instructed her to start at 5pm the evening before colonoscopy drink 8 oz every 30 minutes until she completes the entire contents.  Patient inquired about billing.  She was provided the diagnosis and procedure code for her colonoscopy.  Also advised that she may go to the [facility] website and do a search on out of pocket cost.  Thanks,  [place], [place]

## 2019-09-22 ENCOUNTER — Ambulatory Visit
Admission: RE | Admit: 2019-09-22 | Discharge: 2019-09-22 | Disposition: A | Discharge status: Home / Self Care | Payer: BC Managed Care – PPO | Attending: Gastroenterology | Admitting: Gastroenterology

## 2019-09-22 ENCOUNTER — Ambulatory Visit: Payer: BC Managed Care – PPO | Admitting: Anesthesiology

## 2019-09-22 ENCOUNTER — Encounter
Admission: RE | Disposition: A | Discharge status: Home / Self Care | Payer: Self-pay | Source: Home / Self Care | Attending: Gastroenterology

## 2019-09-22 ENCOUNTER — Other Ambulatory Visit: Payer: Self-pay

## 2019-09-22 ENCOUNTER — Encounter: Payer: Self-pay | Admitting: Gastroenterology

## 2019-09-22 DIAGNOSIS — Z881 Allergy status to other antibiotic agents status: Secondary | ICD-10-CM | POA: Insufficient documentation

## 2019-09-22 DIAGNOSIS — D125 Benign neoplasm of sigmoid colon: Secondary | ICD-10-CM | POA: Insufficient documentation

## 2019-09-22 DIAGNOSIS — K635 Polyp of colon: Secondary | ICD-10-CM | POA: Diagnosis not present

## 2019-09-22 DIAGNOSIS — Z888 Allergy status to other drugs, medicaments and biological substances status: Secondary | ICD-10-CM | POA: Diagnosis not present

## 2019-09-22 DIAGNOSIS — M419 Scoliosis, unspecified: Secondary | ICD-10-CM | POA: Diagnosis not present

## 2019-09-22 DIAGNOSIS — D123 Benign neoplasm of transverse colon: Secondary | ICD-10-CM | POA: Insufficient documentation

## 2019-09-22 DIAGNOSIS — Z1211 Encounter for screening for malignant neoplasm of colon: Secondary | ICD-10-CM | POA: Diagnosis not present

## 2019-09-22 HISTORY — PX: COLONOSCOPY WITH PROPOFOL: SHX5780

## 2019-09-22 HISTORY — PX: POLYPECTOMY: SHX5525

## 2019-09-22 HISTORY — DX: Motion sickness, initial encounter: T75.3XXA

## 2019-09-22 HISTORY — DX: Other complications of anesthesia, initial encounter: T88.59XA

## 2019-09-22 HISTORY — DX: Scoliosis, unspecified: M41.9

## 2019-09-22 HISTORY — DX: Presence of spectacles and contact lenses: Z97.3

## 2019-09-22 SURGERY — COLONOSCOPY WITH PROPOFOL
Anesthesia: General | Site: Rectum

## 2019-09-22 MED ORDER — LACTATED RINGERS IV SOLN: INTRAVENOUS | Status: DC

## 2019-09-22 MED ORDER — STERILE WATER FOR IRRIGATION IR SOLN
Status: DC | PRN
  Administered 2019-09-22: 50 mL

## 2019-09-22 MED ORDER — PROPOFOL 10 MG/ML IV BOLUS
INTRAVENOUS | Status: DC | PRN
  Administered 2019-09-22: 40 mg via INTRAVENOUS
  Administered 2019-09-22 (×2): 50 mg via INTRAVENOUS
  Administered 2019-09-22: 100 mg via INTRAVENOUS
  Administered 2019-09-22: 40 mg via INTRAVENOUS

## 2019-09-22 MED ORDER — LIDOCAINE HCL (CARDIAC) PF 100 MG/5ML IV SOSY
PREFILLED_SYRINGE | INTRAVENOUS | Status: DC | PRN
  Administered 2019-09-22: 30 mg via INTRAVENOUS

## 2019-09-22 SURGICAL SUPPLY — 8 items
CANISTER SUCT 1200ML W/VALVE (MISCELLANEOUS) ×3 IMPLANT
FORCEPS BIOP RAD 4 LRG CAP 4 (CUTTING FORCEPS) ×2 IMPLANT
GOWN CVR UNV OPN BCK APRN NK (MISCELLANEOUS) ×2 IMPLANT
GOWN ISOL THUMB LOOP REG UNIV (MISCELLANEOUS) ×4
KIT ENDO PROCEDURE OLY (KITS) ×3 IMPLANT
SNARE SHORT THROW 13M SML OVAL (MISCELLANEOUS) ×2 IMPLANT
TRAP ETRAP POLY (MISCELLANEOUS) ×2 IMPLANT
WATER STERILE IRR 250ML POUR (IV SOLUTION) ×3 IMPLANT

## 2019-09-22 NOTE — Anesthesia Preprocedure Evaluation (Signed)
Anesthesia Evaluation  Patient identified by MRN, date of birth, ID band Patient awake    Reviewed: NPO status   History of Anesthesia Complications Negative for: history of anesthetic complications  Airway Mallampati: II  TM Distance: >3 FB Neck ROM: full    Dental no notable dental hx.    Pulmonary neg pulmonary ROS,    Pulmonary exam normal        Cardiovascular Exercise Tolerance: Good negative cardio ROS Normal cardiovascular exam  Chest pain, on Jul 08, 2019. -New problem that occurred once more than a month ago -Her description of the crushing chest pain that radiated to her back and was associated with shortness of breath and diaphoresis is concerning for a possible cardiac etiology -Given that it was remote and not recent, she does not need to be seen emergently, but we did discuss the importance of her being evaluated by cardiology, possibly with stress testing, to evaluate her future risk.  No recurrence of chest discomfort.   Neuro/Psych negative neurological ROS  negative psych ROS   GI/Hepatic negative GI ROS, Neg liver ROS,   Endo/Other  negative endocrine ROS  Renal/GU negative Renal ROS  negative genitourinary   Musculoskeletal scoliosis   Abdominal   Peds  Hematology negative hematology ROS (+)   Anesthesia Other Findings Covid: NEG.  Reproductive/Obstetrics                             Anesthesia Physical Anesthesia Plan  ASA: II  Anesthesia Plan: General   Post-op Pain Management:    Induction:   PONV Risk Score and Plan: 3 and Propofol infusion, TIVA and Ondansetron  Airway Management Planned:   Additional Equipment:   Intra-op Plan:   Post-operative Plan:   Informed Consent: I have reviewed the patients History and Physical, chart, labs and discussed the procedure including the risks, benefits and alternatives for the proposed anesthesia with the  patient or authorized representative who has indicated his/her understanding and acceptance.       Plan Discussed with: CRNA  Anesthesia Plan Comments:         Anesthesia Quick Evaluation

## 2019-09-22 NOTE — H&P (Signed)
Anna Lame, MD [address] [place], [place] [address] Phone: [phone] Fax : [phone]  Primary Care Physician:  Virginia Crews, MD Primary Gastroenterologist:  Dr. Allen Norris  Pre-Procedure History & Physical: HPI:  Anna Hammond is a 59 y.o. female is here for a screening colonoscopy.   Past Medical History:  Diagnosis Date  . Complication of anesthesia    "epidural shakes"   . Motion sickness    cars  . Scoliosis   . Wears contact lenses     Past Surgical History:  Procedure Laterality Date  . ADENOIDECTOMY    . BREAST BIOPSY Left    cyst removed  . SHOULDER ARTHROSCOPY Right     Prior to Admission medications   Medication Sig Start Date End Date Taking? Authorizing Provider  Multiple Vitamins-Minerals (AIRBORNE PO) Take by mouth as needed.   Yes [provider]    Allergies as of 08/19/2019 - Review Complete 08/18/2019  Allergen Reaction Noted  . Macrobid [nitrofurantoin macrocrystal] Nausea Only 09/01/2016  . Nitrofurantoin Nausea Only and Nausea And Vomiting 06/22/2014    Family History  Problem Relation Age of Onset  . Cancer Mother        Theadora Rama cancer  . Lung cancer Father        smoker  . Lung cancer Paternal Grandfather        smoker  . Other Daughter        brain cyst  . Breast cancer Neg Hx     Social History   Socioeconomic History  . Marital status: Married    Spouse name: Not on file  . Number of children: 4  . Years of education: Not on file  . Highest education level: Not on file  Occupational History  . Occupation: Pharmacist, hospital     Comment: teaches teachers  Tobacco Use  . Smoking status: Never Smoker  . Smokeless tobacco: Never Used  Substance and Sexual Activity  . Alcohol use: Yes    Comment: once a month  . Drug use: Never  . Sexual activity: Yes    Partners: Male    Birth control/protection: Post-menopausal  Other Topics Concern  . Not on file  Social History Narrative  . Not on  file   Social Determinants of Health   Financial Resource Strain:   . Difficulty of Paying Living Expenses: Not on file  Food Insecurity:   . Worried About Charity fundraiser in the Last Year: Not on file  . Ran Out of Food in the Last Year: Not on file  Transportation Needs:   . Lack of Transportation (Medical): Not on file  . Lack of Transportation (Non-Medical): Not on file  Physical Activity:   . Days of Exercise per Week: Not on file  . Minutes of Exercise per Session: Not on file  Stress:   . Feeling of Stress : Not on file  Social Connections:   . Frequency of Communication with Friends and Family: Not on file  . Frequency of Social Gatherings with Friends and Family: Not on file  . Attends Religious Services: Not on file  . Active Member of Clubs or Organizations: Not on file  . Attends Archivist Meetings: Not on file  . Marital Status: Not on file  Intimate Partner Violence:   . Fear of Current or Ex-Partner: Not on file  . Emotionally Abused: Not on file  . Physically Abused: Not on file  . Sexually Abused: Not on  file    Review of Systems: See HPI, otherwise negative ROS  Physical Exam: BP 117/89   Pulse 70   Temp 97.9 F (36.6 C) (Temporal)   Resp 18   Ht 5\' 6"  (1.676 m)   Wt 62.6 kg   SpO2 100%   BMI 22.27 kg/m  General:   Alert,  pleasant and cooperative in NAD Head:  Normocephalic and atraumatic. Neck:  Supple; no masses or thyromegaly. Lungs:  Clear throughout to auscultation.    Heart:  Regular rate and rhythm. Abdomen:  Soft, nontender and nondistended. Normal bowel sounds, without guarding, and without rebound.   Neurologic:  Alert and  oriented x4;  grossly normal neurologically.  Impression/Plan: Anna Hammond is now here to undergo a screening colonoscopy.  Risks, benefits, and alternatives regarding colonoscopy have been reviewed with the patient.  Questions have been answered.  All parties agreeable.

## 2019-09-22 NOTE — Anesthesia Procedure Notes (Signed)
Procedure Name: MAC Performed by: Majesta Leichter, CRNA Pre-anesthesia Checklist: Patient identified, Emergency Drugs available, Suction available, Timeout performed and Patient being monitored Patient Re-evaluated:Patient Re-evaluated prior to induction Oxygen Delivery Method: Nasal cannula Placement Confirmation: positive ETCO2       

## 2019-09-22 NOTE — Op Note (Signed)
[facility] Gastroenterology Patient Name: Sinai Hamideh Procedure Date: 09/22/2019 9:16 AM MRN: [id number] Account #: [id number] Date of Birth: [birth year] Admit Type: Outpatient Age: 59 Room: [facility] OR ROOM 01 Gender: Female Note Status: Finalized Procedure:             Colonoscopy Indications:           Screening for colorectal malignant neoplasm Providers:             Lucilla Lame MD, MD Referring MD:          Dionne Bucy. Bacigalupo (Referring MD) Medicines:             Propofol per Anesthesia Complications:         No immediate complications. Procedure:             Pre-Anesthesia Assessment:                        - Prior to the procedure, a History and Physical was                         performed, and patient medications and allergies were                         reviewed. The patient's tolerance of previous                         anesthesia was also reviewed. The risks and benefits                         of the procedure and the sedation options and risks                         were discussed with the patient. All questions were                         answered, and informed consent was obtained. Prior                         Anticoagulants: The patient has taken no previous                         anticoagulant or antiplatelet agents. ASA Grade                         Assessment: II - A patient with mild systemic disease.                         After reviewing the risks and benefits, the patient                         was deemed in satisfactory condition to undergo the                         procedure.                        After obtaining informed consent, the colonoscope was  passed under direct vision. Throughout the procedure,                         the patient's blood pressure, pulse, and oxygen                         saturations were monitored continuously. The was                         introduced through the anus and  advanced to the the                         cecum, identified by appendiceal orifice and ileocecal                         valve. The colonoscopy was performed without                         difficulty. The patient tolerated the procedure well.                         The quality of the bowel preparation was fair. Findings:      The perianal and digital rectal examinations were normal.      A 5 mm polyp was found in the ascending colon. The polyp was sessile.       The polyp was removed with a cold snare. Resection and retrieval were       complete.      A 3 mm polyp was found in the transverse colon. The polyp was sessile.       The polyp was removed with a cold biopsy forceps. Resection and       retrieval were complete.      Two sessile polyps were found in the descending colon. The polyps were 3       to 4 mm in size. These polyps were removed with a cold biopsy forceps.       Resection and retrieval were complete.      Two sessile polyps were found in the sigmoid colon. The polyps were 6 to       7 mm in size. These polyps were removed with a cold snare. Resection and       retrieval were complete. Impression:            - Preparation of the colon was fair.                        - One 5 mm polyp in the ascending colon, removed with                         a cold snare. Resected and retrieved.                        - One 3 mm polyp in the transverse colon, removed with                         a cold biopsy forceps. Resected and retrieved.                        - Two 3  to 4 mm polyps in the descending colon,                         removed with a cold biopsy forceps. Resected and                         retrieved.                        - Two 6 to 7 mm polyps in the sigmoid colon, removed                         with a cold snare. Resected and retrieved. Recommendation:        - Discharge patient to home.                        - Resume previous diet.                        -  Continue present medications.                        - Await pathology results.                        - Repeat colonoscopy in 5 years if polyp adenoma and                         10 years if hyperplastic Procedure Code(s):     --- Professional ---                        [id number], Colonoscopy, flexible; with removal of                         tumor(s), polyp(s), or other lesion(s) by snare                         technique                        45380, 7, Colonoscopy, flexible; with biopsy, single                         or multiple Diagnosis Code(s):     --- Professional ---                        Z12.11, Encounter for screening for malignant neoplasm                         of colon                        K63.5, Polyp of colon CPT copyright 2019 American Medical Association. All rights reserved. The codes documented in this report are preliminary and upon coder review may  be revised to meet current compliance requirements. Lucilla Lame MD, MD 09/22/2019 9:50:28 AM This report has been signed electronically. Number of Addenda: 0 Note Initiated On: 09/22/2019 9:16 AM Scope Withdrawal Time: 0 hours 10 minutes 8 seconds  Total Procedure Duration: 0 hours 17 minutes 26  seconds  Estimated Blood Loss:  Estimated blood loss: none.      [facility]

## 2019-09-22 NOTE — Transfer of Care (Signed)
Immediate Anesthesia Transfer of Care Note  Patient: Anna Hammond  Procedure(s) Performed: COLONOSCOPY WITH BIOPSY (N/A Rectum) POLYPECTOMY (N/A Rectum)  Patient Location: PACU  Anesthesia Type: General  Level of Consciousness: awake, alert  and patient cooperative  Airway and Oxygen Therapy: Patient Spontanous Breathing and Patient connected to supplemental oxygen  Post-op Assessment: Post-op Vital signs reviewed, Patient's Cardiovascular Status Stable, Respiratory Function Stable, Patent Airway and No signs of Nausea or vomiting  Post-op Vital Signs: Reviewed and stable  Complications: No apparent anesthesia complications

## 2019-09-22 NOTE — Anesthesia Postprocedure Evaluation (Signed)
Anesthesia Post Note  Patient: Anna Hammond  Procedure(s) Performed: COLONOSCOPY WITH BIOPSY (N/A Rectum) POLYPECTOMY (N/A Rectum)     Patient location during evaluation: PACU Anesthesia Type: General Level of consciousness: awake and alert Pain management: pain level controlled Vital Signs Assessment: post-procedure vital signs reviewed and stable Respiratory status: spontaneous breathing, nonlabored ventilation, respiratory function stable and patient connected to nasal cannula oxygen Cardiovascular status: blood pressure returned to baseline and stable Postop Assessment: no apparent nausea or vomiting Anesthetic complications: no    Haik Mahoney

## 2019-09-23 ENCOUNTER — Encounter: Payer: Self-pay | Admitting: *Deleted

## 2019-09-24 ENCOUNTER — Encounter: Payer: Self-pay | Admitting: Gastroenterology

## 2019-09-24 LAB — SURGICAL PATHOLOGY

## 2019-09-30 NOTE — Progress Notes (Signed)
Cardiology Office Note:    Date:  10/01/2019   ID:  Anna Hammond, DOB [birth year], MRN [id number]  PCP:  Virginia Crews, MD  Cardiologist:  Sherley Mckenney  Electrophysiologist:  None   Referring MD: Virginia Crews, MD   Chief Complaint  Patient presents with  . Chest Pain    History of Present Illness:    Anna Hammond is a 59 y.o. female with a hx of chest discomfort.   We were asked to see her for further evaluation of this chest pain by Dr. Brita Romp.  No significant PMH    Anna Hammond is seen today for an episode of CP in Dec. 2020  She had a very stressful phone call.  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   Immediately had CP .   Took several deep breaths Went home,  Still had the pain + pleuretic CP ( with inspiration)   Also developed some intrascapular pain  Developed some numbness in her left arm  Overnight the pins and needles pain in chest pain resolved Still had some back pain and left arm tingling  The next day , took a nap,  When she closed her eyes , saw several flashes of lignt , head a noise , then felt immediately better.   Has walked over the past several days and feels well .   Exercises sporatically, less in the winter, better at exercising in spring and summer  Walks 2-4 miles a day  Has gained a litte weight , has started a diet     Past Medical History:  Diagnosis Date  . Chest pain   . Complication of anesthesia    "epidural shakes"   . Motion sickness    cars  . Polyp of sigmoid colon   . Polyp of transverse colon   . Scoliosis   . Special screening for malignant neoplasms, colon   . Wears contact lenses     Past Surgical History:  Procedure Laterality Date  . ADENOIDECTOMY    . BREAST BIOPSY Left    cyst removed  . COLONOSCOPY WITH PROPOFOL N/A 09/22/2019   Procedure: COLONOSCOPY WITH BIOPSY;  Surgeon: Lucilla Lame, MD;  Location: [facility];  Service: Endoscopy;  Laterality: N/A;   priority 4  . POLYPECTOMY N/A 09/22/2019   Procedure: POLYPECTOMY;  Surgeon: Lucilla Lame, MD;  Location: [facility];  Service: Endoscopy;  Laterality: N/A;  . SHOULDER ARTHROSCOPY Right     Current Medications: Current Meds  Medication Sig  . Multiple Vitamins-Minerals (AIRBORNE PO) Take by mouth as needed.     Allergies:   Equal [aspartame], Macrobid [nitrofurantoin macrocrystal], and Nitrofurantoin   Social History   Socioeconomic History  . Marital status: Married    Spouse name: Not on file  . Number of children: 4  . Years of education: Not on file  . Highest education level: Not on file  Occupational History  . Occupation: Pharmacist, hospital     Comment: teaches teachers  Tobacco Use  . Smoking status: Never Smoker  . Smokeless tobacco: Never Used  Substance and Sexual Activity  . Alcohol use: Yes    Comment: once a month  . Drug use: Never  . Sexual activity: Yes    Partners: Male    Birth control/protection: Post-menopausal  Other Topics Concern  . Not on file  Social History Narrative  . Not on file   Social Determinants of Health  Financial Resource Strain:   . Difficulty of Paying Living Expenses: Not on file  Food Insecurity:   . Worried About Charity fundraiser in the Last Year: Not on file  . Ran Out of Food in the Last Year: Not on file  Transportation Needs:   . Lack of Transportation (Medical): Not on file  . Lack of Transportation (Non-Medical): Not on file  Physical Activity:   . Days of Exercise per Week: Not on file  . Minutes of Exercise per Session: Not on file  Stress:   . Feeling of Stress : Not on file  Social Connections:   . Frequency of Communication with Friends and Family: Not on file  . Frequency of Social Gatherings with Friends and Family: Not on file  . Attends Religious Services: Not on file  . Active Member of Clubs or Organizations: Not on file  . Attends Archivist Meetings: Not on file  . Marital Status:  Not on file     Family History: The patient's family history includes Cancer in her mother; Lung cancer in her father and paternal grandfather; Other in her daughter. There is no history of Breast cancer.  ROS:   Please see the history of present illness.     All other systems reviewed and are negative.  EKGs/Labs/Other Studies Reviewed:    The following studies were reviewed today:   EKG:  October 01, 2019:   NSR at 71.   No ST or T wave changes.   Recent Labs: No results found for requested labs within last 8760 hours.  Recent Lipid Panel    Component Value Date/Time   CHOL 187 11/19/2017 0915   TRIG 103 11/19/2017 0915   HDL 70 11/19/2017 0915   CHOLHDL 2.7 11/19/2017 0915   LDLCALC 96 11/19/2017 0915    Physical Exam:    VS:  BP 124/76   Pulse 74   Ht 5\' 6"  (1.676 m)   Wt 140 lb (63.5 kg)   SpO2 97%   BMI 22.60 kg/m     Wt Readings from Last 3 Encounters:  10/01/19 140 lb (63.5 kg)  09/22/19 138 lb (62.6 kg)  05/22/19 136 lb (61.7 kg)     GEN:  Well nourished, well developed in no acute distress HEENT: Normal NECK: No JVD; No carotid bruits LYMPHATICS: No lymphadenopathy CARDIAC: RRR, no murmurs, rubs, gallops RESPIRATORY:  Clear to auscultation without rales, wheezing or rhonchi  ABDOMEN: Soft, non-tender, non-distended MUSCULOSKELETAL:  No edema; No deformity  SKIN: Warm and dry NEUROLOGIC:  Alert and oriented x 3 PSYCHIATRIC:  Normal affect   ASSESSMENT:    1. Atypical chest pain    PLAN:    In order of problems listed above:  1. Atypical chest pain: Anna Hammond  presents with episodes of atypical chest pain.  Had one episode back in mid December that lasted for about a day.  It occurred immediately after a very stressful phone call at work.  She continued to have some pleuritic type chest pain associated with some mild back pain and some arm tingling.  The pains lasted for about a day.  She took a nap on the second day and the pains immediately resolved  after she woke up from her nap.  She has been very active and has not had any further episodes of discomfort.  She has been able to exercise on a regular basis.  She has not noticed any other abnormalities.  At this point I  do not think that she needs any additional work-up.  Her cardiac exam is normal and so I do not think that an echocardiogram would be necessarily useful.  She also is not having any palpitations and no further episodes of chest pain.  I will see her back on an as-needed basis.   Medication Adjustments/Labs and Tests Ordered: Current medicines are reviewed at length with the patient today.  Concerns regarding medicines are outlined above.  Orders Placed This Encounter  Procedures  . EKG 12-Lead   No orders of the defined types were placed in this encounter.    Patient Instructions  Medication Instructions:  Your physician recommends that you continue on your current medications as directed. Please refer to the Current Medication list given to you today.  *If you need a refill on your cardiac medications before your next appointment, please call your pharmacy*   Lab Work: None Ordered If you have labs (blood work) drawn today and your tests are completely normal, you will receive your results only by: Marland Kitchen MyChart Message (if you have MyChart) OR . A paper copy in the mail If you have any lab test that is abnormal or we need to change your treatment, we will call you to review the results.   Testing/Procedures: None Ordered   Follow-Up: At [facility], you and your health needs are our priority.  As part of our continuing mission to provide you with exceptional heart care, we have created designated Provider Care Teams.  These Care Teams include your primary Cardiologist (physician) and Advanced Practice Providers (APPs -  Physician Assistants and Nurse Practitioners) who all work together to provide you with the care you need, when you need it.   Your next  appointment:    As Needed  The format for your next appointment:   Either In Person or Virtual  Provider:   You may see Mertie Moores, MD or one of the following Advanced Practice Providers on your designated Care Team:    Richardson Dopp, PA-C  Vin Flippin, [place]  Daune Perch, [place]        Signed, Mertie Moores, MD  10/01/2019 5:31 PM    [place]

## 2019-10-01 ENCOUNTER — Ambulatory Visit: Payer: BC Managed Care – PPO | Admitting: Cardiovascular Disease

## 2019-10-01 ENCOUNTER — Other Ambulatory Visit: Payer: Self-pay

## 2019-10-01 ENCOUNTER — Encounter: Payer: Self-pay | Admitting: Cardiovascular Disease

## 2019-10-01 VITALS — BP 124/76 | HR 74 | Ht 66.0 in | Wt 140.0 lb

## 2019-10-01 DIAGNOSIS — R0789 Other chest pain: Secondary | ICD-10-CM | POA: Diagnosis not present

## 2019-10-01 NOTE — Patient Instructions (Signed)
Medication Instructions:  Your physician recommends that you continue on your current medications as directed. Please refer to the Current Medication list given to you today.  *If you need a refill on your cardiac medications before your next appointment, please call your pharmacy*   Lab Work: None Ordered If you have labs (blood work) drawn today and your tests are completely normal, you will receive your results only by: . MyChart Message (if you have MyChart) OR . A paper copy in the mail If you have any lab test that is abnormal or we need to change your treatment, we will call you to review the results.   Testing/Procedures: None Ordered   Follow-Up: At [facility], you and your health needs are our priority.  As part of our continuing mission to provide you with exceptional heart care, we have created designated Provider Care Teams.  These Care Teams include your primary Cardiologist (physician) and Advanced Practice Providers (APPs -  Physician Assistants and Nurse Practitioners) who all work together to provide you with the care you need, when you need it.   Your next appointment:    As Needed  The format for your next appointment:   Either In Person or Virtual  Provider:   You may see Philip Nahser, MD or one of the following Advanced Practice Providers on your designated Care Team:    Scott Weaver, PA-C  Vin Bhagat, PA-C  Janine Hammond, NP     

## 2020-08-12 ENCOUNTER — Other Ambulatory Visit: Payer: Self-pay

## 2020-08-12 ENCOUNTER — Ambulatory Visit
Admission: RE | Admit: 2020-08-12 | Discharge: 2020-08-12 | Disposition: A | Discharge status: Home / Self Care | Payer: Self-pay | Source: Ambulatory Visit | Attending: Family Medicine | Admitting: Family Medicine

## 2020-08-12 VITALS — BP 136/80 | HR 83 | Temp 98.6°F | Resp 16

## 2020-08-12 DIAGNOSIS — R519 Headache, unspecified: Secondary | ICD-10-CM

## 2020-08-12 DIAGNOSIS — U071 COVID-19: Secondary | ICD-10-CM | POA: Insufficient documentation

## 2020-08-12 DIAGNOSIS — J069 Acute upper respiratory infection, unspecified: Secondary | ICD-10-CM

## 2020-08-12 DIAGNOSIS — J029 Acute pharyngitis, unspecified: Secondary | ICD-10-CM | POA: Insufficient documentation

## 2020-08-12 DIAGNOSIS — R059 Cough, unspecified: Secondary | ICD-10-CM | POA: Insufficient documentation

## 2020-08-12 LAB — GROUP A STREP BY PCR: Group A Strep by PCR: NOT DETECTED

## 2020-08-12 MED ORDER — KETOROLAC TROMETHAMINE 10 MG PO TABS: 10.0000 mg | ORAL_TABLET | Freq: Four times a day (QID) | ORAL | 0 refills | Status: DC | PRN

## 2020-08-12 NOTE — ED Triage Notes (Signed)
Pt states that she has a cough, HA, and sore throat. Pt states that sx started over the whole week. Pt states that she would like tested for Step and also was recently tested for Covid this am.

## 2020-08-12 NOTE — ED Provider Notes (Signed)
[facility]    CSN: [id number] Arrival date & time: 08/12/20  1633      History   Chief Complaint Chief Complaint  Patient presents with  . Cough  . Sore Throat  . Headache   HPI  60 year old female presents with above complaints.  Patient reports that her symptoms started on Wednesday.  She reports cough, sore throat, headache.  Also reports that she has been hoarse.  Pain 5/10 in severity.  No relieving factors.  No documented fever.  Patient states that she would like to be tested for strep and for COVID.  No other reported symptoms.  No other complaints.  Past Medical History:  Diagnosis Date  . Chest pain   . Complication of anesthesia    "epidural shakes"   . Motion sickness    cars  . Polyp of sigmoid colon   . Polyp of transverse colon   . Scoliosis   . Special screening for malignant neoplasms, colon   . Wears contact lenses     Patient Active Problem List   Diagnosis Date Noted  . Atypical chest pain 10/01/2019  . Special screening for malignant neoplasms, colon   . Polyp of transverse colon   . Polyp of sigmoid colon     Past Surgical History:  Procedure Laterality Date  . ADENOIDECTOMY    . BREAST BIOPSY Left    cyst removed  . COLONOSCOPY WITH PROPOFOL N/A 09/22/2019   Procedure: COLONOSCOPY WITH BIOPSY;  Surgeon: Lucilla Lame, MD;  Location: [place];  Service: Endoscopy;  Laterality: N/A;  priority 4  . POLYPECTOMY N/A 09/22/2019   Procedure: POLYPECTOMY;  Surgeon: Lucilla Lame, MD;  Location: [place];  Service: Endoscopy;  Laterality: N/A;  . SHOULDER ARTHROSCOPY Right     OB History    Gravida  4   Para  4   Term      Preterm      AB      Living        SAB      IAB      Ectopic      Multiple      Live Births               Home Medications    Prior to Admission medications   Medication Sig Start Date End Date Taking? Authorizing Provider  ketorolac (TORADOL) 10 MG tablet Take 1  tablet (10 mg total) by mouth every 6 (six) hours as needed for moderate pain or severe pain (Headache). 08/12/20  Yes Eldridge Marcott G, DO  Multiple Vitamins-Minerals (AIRBORNE PO) Take by mouth as needed.    [provider]    Family History Family History  Problem Relation Age of Onset  . Cancer Mother        Theadora Rama cancer  . Lung cancer Father        smoker  . Lung cancer Paternal Grandfather        smoker  . Other Daughter        brain cyst  . Breast cancer Neg Hx     Social History Social History   Tobacco Use  . Smoking status: Never Smoker  . Smokeless tobacco: Never Used  Vaping Use  . Vaping Use: Never used  Substance Use Topics  . Alcohol use: Yes    Comment: once a month  . Drug use: Never     Allergies   Equal [aspartame], Macrobid [nitrofurantoin macrocrystal], and Nitrofurantoin  Review of Systems Review of Systems Per HPI  Physical Exam Triage Vital Signs ED Triage Vitals  Enc Vitals Group     BP 08/12/20 1713 136/80     Pulse Rate 08/12/20 1713 83     Resp 08/12/20 1713 16     Temp 08/12/20 1713 98.6 F (37 C)     Temp Source 08/12/20 1713 Oral     SpO2 08/12/20 1713 100 %     Weight --      Height --      Head Circumference --      Peak Flow --      Pain Score 08/12/20 1712 5     Pain Loc --      Pain Edu? --      Excl. in [place]? --    Updated Vital Signs BP 136/80 (BP Location: Right Arm)   Pulse 83   Temp 98.6 F (37 C) (Oral)   Resp 16   SpO2 100%   Visual Acuity Right Eye Distance:   Left Eye Distance:   Bilateral Distance:    Right Eye Near:   Left Eye Near:    Bilateral Near:     Physical Exam Vitals and nursing note reviewed.  Constitutional:      General: She is not in acute distress.    Appearance: Normal appearance. She is not ill-appearing.  HENT:     Head: Normocephalic and atraumatic.     Mouth/Throat:     Pharynx: Oropharynx is clear. No oropharyngeal exudate or posterior oropharyngeal erythema.   Eyes:     General:        Right eye: No discharge.        Left eye: No discharge.     Conjunctiva/sclera: Conjunctivae normal.  Cardiovascular:     Rate and Rhythm: Normal rate and regular rhythm.     Heart sounds: No murmur heard.   Pulmonary:     Effort: Pulmonary effort is normal.     Breath sounds: Normal breath sounds. No wheezing, rhonchi or rales.  Neurological:     Mental Status: She is alert.  Psychiatric:        Mood and Affect: Mood normal.        Behavior: Behavior normal.    UC Treatments / Results  Labs (all labs ordered are listed, but only abnormal results are displayed) Labs Reviewed  GROUP A STREP BY PCR  SARS CORONAVIRUS 2 (TAT 6-24 HRS)    EKG   Radiology No results found.  Procedures Procedures (including critical care time)  Medications Ordered in UC Medications - No data to display  Initial Impression / Assessment and Plan / UC Course  I have reviewed the triage vital signs and the nursing notes.  Pertinent labs & imaging results that were available during my care of the patient were reviewed by me and considered in my medical decision making (see chart for details).    60 year old female presents with URI.  Awaiting COVID test results.  Toradol as needed for headache.  Supportive care.  Final Clinical Impressions(s) / UC Diagnoses   Final diagnoses:  Upper respiratory tract infection, unspecified type     Discharge Instructions     Medication as prescribed.  Stay home.  Check my chart for COVID test results.  Take care  Dr. Lacinda Axon     ED Prescriptions    Medication Sig Dispense Auth. Provider   ketorolac (TORADOL) 10 MG tablet Take 1 tablet (10 mg total)  by mouth every 6 (six) hours as needed for moderate pain or severe pain (Headache). 20 tablet Coral Spikes, DO     PDMP not reviewed this encounter.   Coral Spikes, [place] 08/12/20 1830

## 2020-08-12 NOTE — Discharge Instructions (Signed)
Medication as prescribed.  Stay home.  Check my chart for COVID test results.  Take care  Dr. Olimpia Tinch   

## 2020-08-13 LAB — SARS CORONAVIRUS 2 (TAT 6-24 HRS): SARS Coronavirus 2: POSITIVE — AB

## 2020-12-14 ENCOUNTER — Other Ambulatory Visit: Payer: Self-pay

## 2020-12-14 ENCOUNTER — Ambulatory Visit
Admission: RE | Admit: 2020-12-14 | Discharge: 2020-12-14 | Disposition: A | Discharge status: Home / Self Care | Payer: BC Managed Care – PPO | Source: Ambulatory Visit | Attending: Emergency Medicine | Admitting: Emergency Medicine

## 2020-12-14 ENCOUNTER — Ambulatory Visit: Payer: Self-pay | Admitting: *Deleted

## 2020-12-14 VITALS — BP 118/79 | HR 63 | Temp 97.9°F | Resp 18 | Ht 66.0 in | Wt 130.0 lb

## 2020-12-14 DIAGNOSIS — K1120 Sialoadenitis, unspecified: Secondary | ICD-10-CM | POA: Diagnosis not present

## 2020-12-14 MED ORDER — IBUPROFEN 600 MG PO TABS: 600.0000 mg | ORAL_TABLET | Freq: Four times a day (QID) | ORAL | 0 refills | Status: DC | PRN

## 2020-12-14 MED ORDER — AMOXICILLIN-POT CLAVULANATE 875-125 MG PO TABS: 1.0000 | ORAL_TABLET | Freq: Two times a day (BID) | ORAL | 0 refills | Status: AC

## 2020-12-14 NOTE — Telephone Encounter (Signed)
Noted  

## 2020-12-14 NOTE — ED Provider Notes (Signed)
HPI  SUBJECTIVE:  Anna Hammond is a 60 y.o. female who presents with 5 to 6 weeks of a painful left-sided "lymph node" at the angle of her jaw.  States that it got bigger and became painful yesterday.  Pain radiates to her ear, as she reports facial swelling.  No fevers, nasal congestion, sore throat, dental pain, trismus, neck stiffness, other lymphadenopathy, unintentional weight loss, abdominal pain.  She tried ibuprofen 800 mg with improvement in her symptoms.  Symptoms are worse with palpation.  She took ibuprofen within 6 hours of evaluation.  Past medical history negative for smoking, cancer, multiple myeloma, lymphoma.  ZOX:[id number], Dionne Bucy, MD   Past Medical History:  Diagnosis Date  . Chest pain   . Complication of anesthesia    "epidural shakes"   . Motion sickness    cars  . Polyp of sigmoid colon   . Polyp of transverse colon   . Scoliosis   . Special screening for malignant neoplasms, colon   . Wears contact lenses     Past Surgical History:  Procedure Laterality Date  . ADENOIDECTOMY    . BREAST BIOPSY Left    cyst removed  . COLONOSCOPY WITH PROPOFOL N/A 09/22/2019   Procedure: COLONOSCOPY WITH BIOPSY;  Surgeon: Lucilla Lame, MD;  Location: [place];  Service: Endoscopy;  Laterality: N/A;  priority 4  . POLYPECTOMY N/A 09/22/2019   Procedure: POLYPECTOMY;  Surgeon: Lucilla Lame, MD;  Location: [place];  Service: Endoscopy;  Laterality: N/A;  . SHOULDER ARTHROSCOPY Right     Family History  Problem Relation Age of Onset  . Cancer Mother        Theadora Rama cancer  . Lung cancer Father        smoker  . Lung cancer Paternal Grandfather        smoker  . Other Daughter        brain cyst  . Breast cancer Neg Hx     Social History   Tobacco Use  . Smoking status: Never Smoker  . Smokeless tobacco: Never Used  Vaping Use  . Vaping Use: Never used  Substance Use Topics  . Alcohol use: Yes    Comment: once a month  . Drug use:  Never    No current facility-administered medications for this encounter.  Current Outpatient Medications:  .  amoxicillin-clavulanate (AUGMENTIN) 875-125 MG tablet, Take 1 tablet by mouth 2 (two) times daily for 10 days. X 7 days, Disp: 20 tablet, Rfl: 0 .  ibuprofen (ADVIL) 600 MG tablet, Take 1 tablet (600 mg total) by mouth every 6 (six) hours as needed., Disp: 30 tablet, Rfl: 0 .  prednisoLONE acetate (PRED FORTE) 1 % ophthalmic suspension, SMARTSIG:In Eye(s), Disp: , Rfl:  .  Multiple Vitamins-Minerals (AIRBORNE PO), Take by mouth as needed., Disp: , Rfl:   Allergies  Allergen Reactions  . Equal [Aspartame]     Artificial sweeteners cause knots under skin on face, neck and chest  . Macrobid [Nitrofurantoin Macrocrystal] Nausea Only  . Nitrofurantoin Nausea Only and Nausea And Vomiting     ROS  As noted in HPI.   Physical Exam  BP 118/79 (BP Location: Left Arm)   Pulse 63   Temp 97.9 F (36.6 C) (Oral)   Resp 18   Ht '5\' 6"'  (1.676 m)   Wt 59 kg   SpO2 100%   BMI 20.98 kg/m   Constitutional: Well developed, well nourished, no acute distress Eyes:  EOMI, conjunctiva normal  bilaterally HENT: Normocephalic, atraumatic,mucus membranes moist.  Left TM normal.  No appreciable facial swelling.  Tender over the left parotid gland, particularly at the angle of the jaw.  No expressible purulent drainage from Stensen's duct . no swelling underneath the tongue.  No dental decay, tenderness.  No gingival swelling, tenderness.   Lymph: No submental, preauricular, postauricular, anterior/ posterior cervical or supraclavicular lymphadenopathy Respiratory: Normal inspiratory effort Cardiovascular: Normal rate GI: nondistended skin: No rash, skin intact Musculoskeletal: no deformities Neurologic: Alert & oriented x 3, no focal neuro deficits Psychiatric: Speech and behavior appropriate   ED Course   Medications - No data to display  No orders of the defined types were placed  in this encounter.   No results found for this or any previous visit (from the past 24 hour(s)). No results found.  ED Clinical Impression  1. Parotitis      ED Assessment/Plan  Suspect an early parotitis versus salivary duct stone.  Will cover with Augmentin, warm compresses, sialagogues, follow-up with ENT or PMD as needed.  Dr. Marella Bile on-call.  Discussed , MDM, treatment plan, and plan for follow-up with patient patient agrees with plan.   Meds ordered this encounter  Medications  . amoxicillin-clavulanate (AUGMENTIN) 875-125 MG tablet    Sig: Take 1 tablet by mouth 2 (two) times daily for 10 days. X 7 days    Dispense:  20 tablet    Refill:  0  . ibuprofen (ADVIL) 600 MG tablet    Sig: Take 1 tablet (600 mg total) by mouth every 6 (six) hours as needed.    Dispense:  30 tablet    Refill:  0      *This clinic note was created using [employer]. Therefore, there may be occasional mistakes despite careful proofreading.  ?    Melynda Ripple, MD 12/16/20 [phone]

## 2020-12-14 NOTE — ED Triage Notes (Addendum)
Pt c/o swollen lymph node under her left jaw, also has some ear pain. Pt states the area has been increasing in size over several weeks. Pt also reports area is tender to touch and painful when she opens her jaw. Pt denies dental pain, f/n/v/d or other symptoms.

## 2020-12-14 NOTE — Discharge Instructions (Addendum)
I suspect that you have an early parotitis versus small stone blocking the salivary outflow from the parotid gland  Warm compresses, sour patch kids are lemon drops, 600 mg of ibuprofen combined with 1000 mg of Tylenol,.  The Augmentin will cover any possible infection.

## 2020-12-14 NOTE — Telephone Encounter (Signed)
Quarter-sized lymph node on the left side of neck at the jawline. Notice one month to six weeks ago and now is tender to touch and may not be "contained" any longer. No fever, no ST, no runny nose or ear pain. No difficulty swallowing/breathing. No available appointment for 2 weeks. She would like to be seen today-Advised UC for evaluation.   Reason for Disposition . [1] Large node AND [2] present > 2 weeks  Answer Assessment - Initial Assessment Questions 1. LOCATION: "Where is the swollen node located?" "Is the matching node on the other side of the body also swollen?"      Swollen lymph node left side of neck at the jawline  2. SIZE: "How big is the node?" (e.g., inches or centimeters; or compared to common objects such as pea, bean, marble, golf ball)      Quarter-sized to half dollar 3. ONSET: "When did the swelling start?"      One month-6 weeks ago 4. NECK NODES: "Is there a sore throat, runny nose or other symptoms of a cold?"      None  5. GROIN OR ARMPIT NODES: "Is there a sore, scratch, cut or painful red area on that arm or leg?"      no 6. FEVER: "Do you have a fever?" If Yes, ask: "What is it, how was it measured, and when did it start?"      no 7. CAUSE: "What do you think is causing the swollen lymph nodes?"     Found out she has mold in her house 8. OTHER SYMPTOMS: "Do you have any other symptoms?"     no 9. PREGNANCY: "Is there any chance you are pregnant?" "When was your last menstrual period?"     na  Protocols used: LYMPH NODES - [facility]

## 2021-05-31 ENCOUNTER — Ambulatory Visit: Admit: 2021-05-31 | Payer: BC Managed Care – PPO

## 2021-06-01 ENCOUNTER — Ambulatory Visit
Admission: EM | Admit: 2021-06-01 | Discharge: 2021-06-01 | Disposition: A | Discharge status: Home / Self Care | Payer: BC Managed Care – PPO | Attending: Emergency Medicine | Admitting: Emergency Medicine

## 2021-06-01 ENCOUNTER — Other Ambulatory Visit: Payer: Self-pay

## 2021-06-01 VITALS — BP 114/65 | HR 77 | Temp 98.1°F

## 2021-06-01 DIAGNOSIS — N390 Urinary tract infection, site not specified: Secondary | ICD-10-CM | POA: Diagnosis not present

## 2021-06-01 LAB — POCT URINALYSIS DIP (MANUAL ENTRY)
Bilirubin, UA: NEGATIVE
Glucose, UA: NEGATIVE mg/dL
Ketones, POC UA: NEGATIVE mg/dL
Nitrite, UA: NEGATIVE
Protein Ur, POC: NEGATIVE mg/dL
Spec Grav, UA: 1.02 (ref 1.010–1.025)
Urobilinogen, UA: 0.2 E.U./dL
pH, UA: 5.5 (ref 5.0–8.0)

## 2021-06-01 MED ORDER — CEPHALEXIN 500 MG PO CAPS: 500.0000 mg | ORAL_CAPSULE | Freq: Two times a day (BID) | ORAL | 0 refills | Status: AC

## 2021-06-01 NOTE — ED Triage Notes (Signed)
Pt presents with dysuria and urinary frequency x 1 week. She has tried OTC Azo medication to help with symptoms

## 2021-06-01 NOTE — ED Provider Notes (Signed)
GivenSubjective:    Anna Hammond is a very pleasant 60 y.o. female who presents with concerns for UTI due to dysuria and urinary frequency for the last 1 week.  Patient reports trying over-the-counter Azo to help with symptoms. No unilateral back pain, vomiting, fever, vaginal discharge, concern for STD.  Past medical history, past surgical history, current medications reviewed.  Allergies: is allergic to equal [aspartame], macrobid [nitrofurantoin macrocrystal], and nitrofurantoin.  Review of Systems See HPI   Objective:     Vitals:   06/01/21 1647  BP: 114/65  Pulse: 77  Temp: 98.1 F (36.7 C)  SpO2: 94%     General: Appears well-developed and well-nourished. No acute distress.  Cardiovascular: Normal rate Pulm/Chest: No respiratory distress Abdominal: No CVAT.  Neurological: Alert and oriented to person, place, and time.  Skin: Skin is warm and dry.  Psychiatric: Normal mood, affect, behavior, and thought content.  GU:  Deferred secondary to self-collect specimen  Laboratory:  Orders Placed This Encounter  Procedures   Urine Culture   POCT urinalysis dipstick   Results for orders placed or performed during the hospital encounter of 06/01/21  POCT urinalysis dipstick  Result Value Ref Range   Color, UA yellow yellow   Clarity, UA clear clear   Glucose, UA negative negative mg/dL   Bilirubin, UA negative negative   Ketones, POC UA negative negative mg/dL   Spec Grav, UA 1.020 1.010 - 1.025   Blood, UA trace-intact (A) negative   pH, UA 5.5 5.0 - 8.0   Protein Ur, POC negative negative mg/dL   Urobilinogen, UA 0.2 0.2 or 1.0 E.U./dL   Nitrite, UA Negative Negative   Leukocytes, UA Small (1+) (A) Negative     Assessment:   1. Acute UTI - Urine Culture; Standing - Urine Culture - cephALEXin (KEFLEX) 500 MG capsule; Take 1 capsule (500 mg total) by mouth 2 (two) times daily for 7 days.  Dispense: 14 capsule; Refill: 0  Plan:   MDM: Patient presents  with concerns for UTI due to dysuria and urinary frequency for the last 1 week.  Patient reports trying over-the-counter Azo to help with symptoms. No unilateral back pain, vomiting, fever, vaginal discharge, concern for STD.  Urinalysis reveals trace blood and small leukocytes.  Urine culture pending.  Symptoms along with assessment findings, likely acute UTI.  Rx Keflex to the patient's preferred pharmacy and advised to push fluids and use over-the-counter Uristat or Azo as needed for discomfort.  Advised to present to clinic or to the emergency department for any fever, unilateral back pain or vomiting.  Patient verbalized understanding and agreed with plan.  Patient stable upon discharge.  Return as needed.    Discharge Instructions      You were seen today for an infection in the lower urinary tract. Your urine sample today was sent for a culture. The urine culture will show what type of bacteria grows and if you are on the appropriate antibiotic. If the antibiotic needs to be changed, you will receive a phone call from the follow up nurse who will give you more information. If you do not receive a call, then you are on the correct antibiotic.  Take antibiotics as directed. Finish course even if feeling better sooner. Drink plenty of clear fluids. Over the counter "Uristat" or "Azo Standard" may help your discomfort.  This will turn your urine dark orange or red and may stain contacts (remove before using). You may experience 24 - 48 hours of  continuing discomfort until medication controls the infection.  Return to clinic or go to the ER if you develop a fever, one-sided back pain, or vomiting as these are signs of a worsening infection.          Serafina Royals, [place] 06/01/21 [phone]

## 2021-06-01 NOTE — Discharge Instructions (Addendum)

## 2021-06-02 LAB — URINE CULTURE: Culture: <10000 — AB

## 2021-10-12 NOTE — Progress Notes (Signed)
Complete physical exam   Patient: Anna Hammond   DOB: August 23, 1960   61 y.o. Female  MRN: [id number] Visit Date: 10/13/2021  Today's healthcare provider: Jacky Kindle, FNP   Introduced to nurse practitioner role and practice setting.  All questions answered.  Discussed provider/patient relationship and expectations.   No chief complaint on file.  Subjective    Anna Hammond is a 61 y.o. female who presents today for a complete physical exam.  She reports consuming a general diet. The patient does not participate in regular exercise at present. She generally feels well. She reports sleeping well. She does have additional problems to discuss today.   HPI   Past Medical History:  Diagnosis Date   Chest pain    Complication of anesthesia    "epidural shakes"    Motion sickness    cars   Polyp of sigmoid colon    Polyp of transverse colon    Scoliosis    Special screening for malignant neoplasms, colon    Wears contact lenses    Past Surgical History:  Procedure Laterality Date   ADENOIDECTOMY     BREAST BIOPSY Left    cyst removed   COLONOSCOPY WITH PROPOFOL N/A 09/22/2019   Procedure: COLONOSCOPY WITH BIOPSY;  Surgeon: Midge Minium, MD;  Location: [facility];  Service: Endoscopy;  Laterality: N/A;  priority 4   POLYPECTOMY N/A 09/22/2019   Procedure: POLYPECTOMY;  Surgeon: Midge Minium, MD;  Location: [facility];  Service: Endoscopy;  Laterality: N/A;   SHOULDER ARTHROSCOPY Right    Social History   Socioeconomic History   Marital status: Married    Spouse name: Not on file   Number of children: 4   Years of education: Not on file   Highest education level: Not on file  Occupational History   Occupation: Runner, broadcasting/film/video     Comment: teaches teachers  Tobacco Use   Smoking status: Never   Smokeless tobacco: Never  Vaping Use   Vaping Use: Never used  Substance and Sexual Activity   Alcohol use: Yes    Comment: once a month   Drug use:  Never   Sexual activity: Yes    Partners: Male    Birth control/protection: Post-menopausal  Other Topics Concern   Not on file  Social History Narrative   Not on file   Social Determinants of Health   Financial Resource Strain: Not on file  Food Insecurity: Not on file  Transportation Needs: Not on file  Physical Activity: Not on file  Stress: Not on file  Social Connections: Not on file  Intimate Partner Violence: Not on file   Family Status  Relation Name Status   Mother  Alive   Father  (Not Specified)   PGF  (Not Specified)   Daughter  (Not Specified)   Neg Hx  (Not Specified)   Family History  Problem Relation Age of Onset   Cancer Mother        uterian cancer   Lung cancer Father        smoker   Lung cancer Paternal Grandfather        smoker   Other Daughter        brain cyst   Breast cancer Neg Hx    Allergies  Allergen Reactions   Equal [Aspartame]     Artificial sweeteners cause knots under skin on face, neck and chest   Macrobid [Nitrofurantoin Macrocrystal] Nausea Only   Nitrofurantoin Nausea Only  and Nausea And Vomiting    Patient Care Team: Erasmo Downer, MD as PCP - General (Family Medicine) Nahser, Deloris Ping, MD as PCP - Cardiology (Cardiology)   Medications: Outpatient Medications Prior to Visit  Medication Sig   ibuprofen (ADVIL) 600 MG tablet Take 1 tablet (600 mg total) by mouth every 6 (six) hours as needed.   Multiple Vitamins-Minerals (AIRBORNE PO) Take by mouth as needed.   prednisoLONE acetate (PRED FORTE) 1 % ophthalmic suspension SMARTSIG:In Eye(s)   No facility-administered medications prior to visit.    Review of Systems  Constitutional: Negative.   HENT:  Positive for tinnitus.   Eyes: Negative.   Respiratory: Negative.    Cardiovascular: Negative.   Gastrointestinal:  Positive for constipation.  Endocrine: Negative.   Genitourinary:  Positive for enuresis.  Musculoskeletal: Negative.   Skin: Negative.    Allergic/Immunologic: Negative.   Neurological: Negative.   Hematological: Negative.   Psychiatric/Behavioral: Negative.        Objective    BP 115/75 (BP Location: Right Arm, Patient Position: Sitting, Cuff Size: Normal)   Pulse 62   Temp 98.9 F (37.2 C) (Oral)   Wt 142 lb (64.4 kg)   SpO2 100%   BMI 22.92 kg/m      Physical Exam Vitals and nursing note reviewed.  Constitutional:      General: She is awake. She is not in acute distress.    Appearance: Normal appearance. She is well-developed, well-groomed and normal weight. She is not ill-appearing, toxic-appearing or diaphoretic.  HENT:     Head: Normocephalic and atraumatic.     Jaw: There is normal jaw occlusion. No trismus, tenderness, swelling or pain on movement.     Right Ear: Tympanic membrane, ear canal and external ear normal. Decreased hearing noted. There is no impacted cerumen.     Left Ear: Hearing, tympanic membrane, ear canal and external ear normal. There is no impacted cerumen.     Nose: Nose normal. No congestion or rhinorrhea.     Right Turbinates: Not enlarged, swollen or pale.     Left Turbinates: Not enlarged, swollen or pale.     Right Sinus: No maxillary sinus tenderness or frontal sinus tenderness.     Left Sinus: No maxillary sinus tenderness or frontal sinus tenderness.     Mouth/Throat:     Lips: Pink.     Mouth: Mucous membranes are moist. No injury.     Tongue: No lesions.     Pharynx: Oropharynx is clear. Uvula midline. No pharyngeal swelling, oropharyngeal exudate, posterior oropharyngeal erythema or uvula swelling.     Tonsils: No tonsillar exudate or tonsillar abscesses.  Eyes:     General: Lids are normal. Lids are everted, no foreign bodies appreciated. Vision grossly intact. Gaze aligned appropriately. No allergic shiner or visual field deficit.       Right eye: No discharge.        Left eye: No discharge.     Extraocular Movements: Extraocular movements intact.      Conjunctiva/sclera: Conjunctivae normal.     Right eye: Right conjunctiva is not injected. No exudate.    Left eye: Left conjunctiva is not injected. No exudate.    Pupils: Pupils are equal, round, and reactive to light.  Neck:     Thyroid: No thyroid mass, thyromegaly or thyroid tenderness.     Vascular: No carotid bruit.     Trachea: Trachea normal.  Cardiovascular:     Rate and Rhythm: Normal rate and  regular rhythm.     Pulses: Normal pulses.          Carotid pulses are 2+ on the right side and 2+ on the left side.      Radial pulses are 2+ on the right side and 2+ on the left side.       Dorsalis pedis pulses are 2+ on the right side and 2+ on the left side.       Posterior tibial pulses are 2+ on the right side and 2+ on the left side.     Heart sounds: Normal heart sounds, S1 normal and S2 normal. No murmur heard.   No friction rub. No gallop.  Pulmonary:     Effort: Pulmonary effort is normal. No respiratory distress.     Breath sounds: Normal breath sounds and air entry. No stridor. No wheezing, rhonchi or rales.  Chest:     Chest wall: No tenderness.     Comments: Breasts: breasts appear normal, no suspicious masses, no skin or nipple changes or axillary nodes, right breast normal without mass, skin or nipple changes or axillary nodes, left breast normal without mass, skin or nipple changes or axillary nodes, risk and benefit of breast self-exam was discussed  Abdominal:     General: Abdomen is flat. Bowel sounds are normal. There is no distension.     Palpations: Abdomen is soft. There is no mass.     Tenderness: There is no abdominal tenderness. There is no right CVA tenderness, left CVA tenderness, guarding or rebound.     Hernia: No hernia is present. There is no hernia in the left inguinal area or right inguinal area.  Genitourinary:    Exam position: Lithotomy position.     Tanner stage (genital): 5.     Vagina: Normal.     Cervix: Normal.     Uterus: Normal.       Adnexa: Right adnexa normal and left adnexa normal.  Musculoskeletal:        General: No swelling, tenderness, deformity or signs of injury. Normal range of motion.     Cervical back: Full passive range of motion without pain, normal range of motion and neck supple. No edema, rigidity or tenderness. No muscular tenderness.     Right lower leg: No edema.     Left lower leg: No edema.  Lymphadenopathy:     Cervical: No cervical adenopathy.     Right cervical: No superficial, deep or posterior cervical adenopathy.    Left cervical: No superficial, deep or posterior cervical adenopathy.     Lower Body: No right inguinal adenopathy. No left inguinal adenopathy.  Skin:    General: Skin is warm and dry.     Capillary Refill: Capillary refill takes less than 2 seconds.     Coloration: Skin is not jaundiced or pale.     Findings: No bruising, erythema, lesion or rash.  Neurological:     General: No focal deficit present.     Mental Status: She is alert and oriented to person, place, and time. Mental status is at baseline.     GCS: GCS eye subscore is 4. GCS verbal subscore is 5. GCS motor subscore is 6.     Sensory: Sensation is intact. No sensory deficit.     Motor: Motor function is intact. No weakness.     Coordination: Coordination is intact. Coordination normal.     Gait: Gait is intact. Gait normal.  Psychiatric:  Attention and Perception: Attention and perception normal.        Mood and Affect: Mood and affect normal.        Speech: Speech normal.        Behavior: Behavior normal. Behavior is cooperative.        Thought Content: Thought content normal.        Cognition and Memory: Cognition and memory normal.        Judgment: Judgment normal.    Last depression screening scores PHQ 2/9 Scores 10/13/2021 05/22/2019 10/18/2017  PHQ - 2 Score 0 0 0  PHQ- 9 Score 0 - 2   Last fall risk screening Fall Risk  10/13/2021  Falls in the past year? 0  Number falls in past yr: -   Injury with Fall? -  Follow up -   Last Audit-C alcohol use screening Alcohol Use Disorder Test (AUDIT) 10/13/2021  1. How often do you have a drink containing alcohol? 2   A score of 3 or more in women, and 4 or more in men indicates increased risk for alcohol abuse, EXCEPT if all of the points are from question 1   No results found for any visits on 10/13/21.  Assessment & Plan    Routine Health Maintenance and Physical Exam  Exercise Activities and Dietary recommendations  Goals   None     Immunization History  Administered Date(s) Administered   Influenza, Quadrivalent, Recombinant, Inj, Pf 06/23/2019   Influenza-Unspecified 06/23/2019   Measles 08/26/1987   Td 11/24/2010, 05/26/2018   Tdap 11/24/2010    Health Maintenance  Topic Date Due   Zoster Vaccines- Shingrix (1 of 2) Never done   MAMMOGRAM  11/28/2019   PAP SMEAR-Modifier  12/09/2019   COLONOSCOPY (Pts 45-64yrs Insurance coverage will need to be confirmed)  09/21/2024   TETANUS/TDAP  05/26/2028   Hepatitis C Screening  Completed   HIV Screening  Completed   HPV VACCINES  Aged Out   INFLUENZA VACCINE  Discontinued    Discussed health benefits of physical activity, and encouraged her to engage in regular exercise appropriate for her age and condition.  Problem List Items Addressed This Visit       Nervous and Auditory   Hearing loss of right ear    Chronic, was seen previously be audiology Eligible for hearing aid Previously did not want to spend the $5,000/1 ear cost Has a return visit in 10/2021 Also associated with tinnitus Has alerted coworkers to hearing loss; denies impact on job/home routine        Other   Annual physical exam - Primary    UTD on dental UTD on vision Things to do to keep yourself healthy  - Exercise at least 30-45 minutes a day, 3-4 days a week.  - Eat a low-fat diet with lots of fruits and vegetables, up to 7-9 servings per day.  - Seatbelts can save your life. Wear  them always.  - Smoke detectors on every level of your home, check batteries every year.  - Eye Doctor - have an eye exam every 1-2 years  - Safe sex - if you may be exposed to STDs, use a condom.  - Alcohol -  If you drink, do it moderately, less than 2 drinks per day.  - Health Care Power of Attorney. Choose someone to speak for you if you are not able.  - Depression is common in our stressful world.If you're feeling down or losing interest in things you  normally enjoy, please come in for a visit.  - Violence - If anyone is threatening or hurting you, please call immediately.        Relevant Orders   CBC with Differential/Platelet   Comprehensive metabolic panel   Lipid Panel With LDL/HDL Ratio   TSH + free T4   Cervical cancer screening    Denies vaginal complaints. Due for cervical cancer screening. Discussed screening guidelines based on age and previous results. Discussed co-testing options with HPV.      Relevant Orders   Cytology - PAP   Constipation    Chronic, improved Has started taking culturelle pre-biotics Hx of colon polyps; due for repeat colon cancer screening in 2026      Dribbling following urination    Acute new concern Notices dribbling of urine following urination if she is "in a hurry" Encouraged second void attempt with forward lean to compress bladder muscle If continues to worsen or is exacerbated, will refer to urology Hx of 4 children reported "doing her kegels"      Encounter for screening mammogram for malignant neoplasm of breast    Due for screening for mammogram, denies breast concerns, provided with phone number to call and schedule appointment for mammogram. Encouraged to repeat breast cancer screening every 1-2 years.       Relevant Orders   MM 3D SCREEN BREAST BILATERAL   MM 3D SCREEN BREAST BILATERAL   Tinnitus of both ears    Chronic, stable R side hearing loss Previously f/b audiology        Return in about 1 year (around  10/14/2022) for annual examination.     Leilani Merl, FNP, have reviewed all documentation for this visit. The documentation on 10/13/21 for the exam, diagnosis, procedures, and orders are all accurate and complete.    Jacky Kindle, FNP  [facility] [phone] (phone) [phone] (fax)  [facility]

## 2021-10-13 ENCOUNTER — Ambulatory Visit (INDEPENDENT_AMBULATORY_CARE_PROVIDER_SITE_OTHER): Payer: BC Managed Care – PPO | Admitting: Family Medicine

## 2021-10-13 ENCOUNTER — Other Ambulatory Visit: Payer: Self-pay

## 2021-10-13 ENCOUNTER — Encounter: Payer: Self-pay | Admitting: Family Medicine

## 2021-10-13 VITALS — BP 115/75 | HR 62 | Temp 98.9°F | Wt 142.0 lb

## 2021-10-13 DIAGNOSIS — H9191 Unspecified hearing loss, right ear: Secondary | ICD-10-CM

## 2021-10-13 DIAGNOSIS — N3943 Post-void dribbling: Secondary | ICD-10-CM | POA: Insufficient documentation

## 2021-10-13 DIAGNOSIS — Z0000 Encounter for general adult medical examination without abnormal findings: Secondary | ICD-10-CM | POA: Insufficient documentation

## 2021-10-13 DIAGNOSIS — Z1231 Encounter for screening mammogram for malignant neoplasm of breast: Secondary | ICD-10-CM | POA: Insufficient documentation

## 2021-10-13 DIAGNOSIS — H9313 Tinnitus, bilateral: Secondary | ICD-10-CM | POA: Diagnosis not present

## 2021-10-13 DIAGNOSIS — K5900 Constipation, unspecified: Secondary | ICD-10-CM

## 2021-10-13 DIAGNOSIS — Z124 Encounter for screening for malignant neoplasm of cervix: Secondary | ICD-10-CM

## 2021-10-13 NOTE — Assessment & Plan Note (Signed)
UTD on dental °UTD on vision °Things to do to keep yourself healthy  °- Exercise at least 30-45 minutes a day, 3-4 days a week.  °- Eat a low-fat diet with lots of fruits and vegetables, up to 7-9 servings per day.  °- Seatbelts can save your life. Wear them always.  °- Smoke detectors on every level of your home, check batteries every year.  °- Eye Doctor - have an eye exam every 1-2 years  °- Safe sex - if you may be exposed to STDs, use a condom.  °- Alcohol -  If you drink, do it moderately, less than 2 drinks per day.  °- Health Care Power of Attorney. Choose someone to speak for you if you are not able.  °- Depression is common in our stressful world.If you're feeling down or losing interest in things you normally enjoy, please come in for a visit.  °- Violence - If anyone is threatening or hurting you, please call immediately. ° ° °

## 2021-10-13 NOTE — Assessment & Plan Note (Signed)
Chronic, improved ?Has started taking culturelle pre-biotics ?Hx of colon polyps; due for repeat colon cancer screening in 2026 ?

## 2021-10-13 NOTE — Assessment & Plan Note (Signed)
Chronic, stable ?R side hearing loss ?Previously f/b audiology ?

## 2021-10-13 NOTE — Assessment & Plan Note (Signed)
Chronic, was seen previously be audiology ?Eligible for hearing aid ?Previously did not want to spend the $5,000/1 ear cost ?Has a return visit in 10/2021 ?Also associated with tinnitus ?Has alerted coworkers to hearing loss; denies impact on job/home routine ?

## 2021-10-13 NOTE — Assessment & Plan Note (Signed)
Acute new concern ?Notices dribbling of urine following urination if she is "in a hurry" ?Encouraged second void attempt with forward lean to compress bladder muscle ?If continues to worsen or is exacerbated, will refer to urology ?Hx of 4 children reported "doing her kegels" ?

## 2021-10-13 NOTE — Assessment & Plan Note (Signed)
Denies vaginal complaints. Due for cervical cancer screening. Discussed screening guidelines based on age and previous results. Discussed co-testing options with HPV. ?

## 2021-10-13 NOTE — Assessment & Plan Note (Signed)
Due for screening for mammogram, denies breast concerns, provided with phone number to call and schedule appointment for mammogram. Encouraged to repeat breast cancer screening every 1-2 years.  

## 2021-10-14 LAB — CBC WITH DIFFERENTIAL/PLATELET
Basophils Absolute: 0.1 10*3/uL (ref 0.0–0.2)
Basos: 1 %
EOS (ABSOLUTE): 0.1 10*3/uL (ref 0.0–0.4)
Eos: 2 %
Hematocrit: 43.2 % (ref 34.0–46.6)
Hemoglobin: 14.6 g/dL (ref 11.1–15.9)
Immature Grans (Abs): 0 10*3/uL (ref 0.0–0.1)
Immature Granulocytes: 0 %
Lymphocytes Absolute: 2 10*3/uL (ref 0.7–3.1)
Lymphs: 30 %
MCH: 30.6 pg (ref 26.6–33.0)
MCHC: 33.8 g/dL (ref 31.5–35.7)
MCV: 91 fL (ref 79–97)
Monocytes Absolute: 0.6 10*3/uL (ref 0.1–0.9)
Monocytes: 9 %
Neutrophils Absolute: 3.8 10*3/uL (ref 1.4–7.0)
Neutrophils: 58 %
Platelets: 287 10*3/uL (ref 150–450)
RBC: 4.77 x10E6/uL (ref 3.77–5.28)
RDW: 11.7 % (ref 11.7–15.4)
WBC: 6.6 10*3/uL (ref 3.4–10.8)

## 2021-10-14 LAB — COMPREHENSIVE METABOLIC PANEL
ALT: 15 IU/L (ref 0–32)
AST: 21 IU/L (ref 0–40)
Albumin/Globulin Ratio: 1.9 (ref 1.2–2.2)
Albumin: 4.7 g/dL (ref 3.8–4.8)
Alkaline Phosphatase: 66 IU/L (ref 44–121)
BUN/Creatinine Ratio: 15 (ref 12–28)
BUN: 12 mg/dL (ref 8–27)
Bilirubin Total: 0.8 mg/dL (ref 0.0–1.2)
CO2: 27 mmol/L (ref 20–29)
Calcium: 9.7 mg/dL (ref 8.7–10.3)
Chloride: 101 mmol/L (ref 96–106)
Creatinine, Ser: 0.79 mg/dL (ref 0.57–1.00)
Globulin, Total: 2.5 g/dL (ref 1.5–4.5)
Glucose: 79 mg/dL (ref 70–99)
Potassium: 4.6 mmol/L (ref 3.5–5.2)
Sodium: 141 mmol/L (ref 134–144)
Total Protein: 7.2 g/dL (ref 6.0–8.5)
eGFR: 85 mL/min/{1.73_m2} (ref >59)

## 2021-10-14 LAB — LIPID PANEL WITH LDL/HDL RATIO
Cholesterol, Total: 206 mg/dL — ABNORMAL HIGH (ref 100–199)
HDL: 79 mg/dL (ref >39)
LDL Chol Calc (NIH): 117 mg/dL — ABNORMAL HIGH (ref 0–99)
LDL/HDL Ratio: 1.5 ratio (ref 0.0–3.2)
Triglycerides: 57 mg/dL (ref 0–149)
VLDL Cholesterol Cal: 10 mg/dL (ref 5–40)

## 2021-10-14 LAB — TSH+FREE T4
Free T4: 1.05 ng/dL (ref 0.82–1.77)
TSH: 1.73 u[IU]/mL (ref 0.450–4.500)

## 2021-10-18 LAB — CYTOLOGY - PAP
Adequacy: ABNORMAL
Comment: NEGATIVE

## 2021-11-08 NOTE — Progress Notes (Signed)
?  ? ?  I,Sulibeya S Dimas,acting as a Education administrator for Lavon Paganini, MD.,have documented all relevant documentation on the behalf of Lavon Paganini, MD,as directed by  Lavon Paganini, MD while in the presence of Lavon Paganini, MD. ? ? ?Established patient visit ? ? ?Patient: Anna Hammond   DOB: 08-12-1960   61 y.o. Female  MRN: [id number] ?Visit Date: 11/09/2021 ? ?Today's healthcare provider: Lavon Paganini, MD  ? ?Chief Complaint  ?Patient presents with  ? Follow-up  ? ?Subjective  ?  ?HPI  ?Patient here to have pap done. Last pap sample was undatisfactory for evaluation due to obscuring inflammation. ? ?Medications: ?Outpatient Medications Prior to Visit  ?Medication Sig  ? Ascorbic Acid (VITAMIN C PO) Take by mouth.  ? [DISCONTINUED] ibuprofen (ADVIL) 600 MG tablet Take 1 tablet (600 mg total) by mouth every 6 (six) hours as needed.  ? [DISCONTINUED] Multiple Vitamins-Minerals (AIRBORNE PO) Take by mouth as needed.  ? [DISCONTINUED] prednisoLONE acetate (PRED FORTE) 1 % ophthalmic suspension SMARTSIG:In Eye(s)  ? ?No facility-administered medications prior to visit.  ? ? ?Review of Systems ? ? ?  Objective  ?  ?BP 96/60 (BP Location: Left Arm, Patient Position: Sitting, Cuff Size: Large)   Pulse 65   Temp 98.1 ?F (36.7 ?C) (Oral)   Resp 16   Wt 141 lb 14.4 oz (64.4 kg)   SpO2 97%   BMI 22.90 kg/m?  ? ? ?Physical Exam ?Vitals reviewed.  ?Constitutional:   ?   General: She is not in acute distress. ?   Appearance: She is well-developed.  ?HENT:  ?   Head: Normocephalic and atraumatic.  ?Eyes:  ?   General: No scleral icterus. ?   Conjunctiva/sclera: Conjunctivae normal.  ?Cardiovascular:  ?   Rate and Rhythm: Normal rate and regular rhythm.  ?Pulmonary:  ?   Effort: Pulmonary effort is normal. No respiratory distress.  ?Genitourinary: ?   Comments: GYN:  External genitalia within normal limits.  Vaginal mucosa pink, moist, normal rugae.  Nonfriable cervix without lesions, no discharge or  bleeding noted on speculum exam.  ?Skin: ?   General: Skin is warm and dry.  ?   Findings: No rash.  ?Neurological:  ?   Mental Status: She is alert and oriented to person, place, and time.  ?Psychiatric:     ?   Behavior: Behavior normal.  ?  ? ? ?No results found for any visits on 11/09/21. ? Assessment & Plan  ?  ? ?Problem List Items Addressed This Visit   ? ?  ? Other  ? Cervical cancer screening - Primary  ?  Last pap non-diagnostic due to inflammation of cells reported by lab ?Will repeat pap smear today ?  ?  ? Relevant Orders  ? Cytology - PAP  ?  ? ?Return in about 1 year (around 11/10/2022) for CPE.  ?   ? ?I, Lavon Paganini, MD, have reviewed all documentation for this visit. The documentation on 11/09/21 for the exam, diagnosis, procedures, and orders are all accurate and complete. ? ? ?Virginia Crews, MD, MPH ?[place] ?[facility]   ?

## 2021-11-09 ENCOUNTER — Ambulatory Visit (INDEPENDENT_AMBULATORY_CARE_PROVIDER_SITE_OTHER): Payer: BC Managed Care – PPO | Admitting: Family Medicine

## 2021-11-09 ENCOUNTER — Encounter: Payer: Self-pay | Admitting: Family Medicine

## 2021-11-09 VITALS — BP 96/60 | HR 65 | Temp 98.1°F | Resp 16 | Wt 141.9 lb

## 2021-11-09 DIAGNOSIS — Z124 Encounter for screening for malignant neoplasm of cervix: Secondary | ICD-10-CM | POA: Diagnosis not present

## 2021-11-09 NOTE — Assessment & Plan Note (Signed)
Last pap non-diagnostic due to inflammation of cells reported by lab ?Will repeat pap smear today ?

## 2021-11-13 LAB — CYTOLOGY - PAP
Comment: NEGATIVE
Diagnosis: UNDETERMINED — AB
High risk HPV: NEGATIVE

## 2021-12-07 ENCOUNTER — Ambulatory Visit
Admission: RE | Admit: 2021-12-07 | Discharge: 2021-12-07 | Disposition: A | Discharge status: Home / Self Care | Payer: BC Managed Care – PPO | Source: Ambulatory Visit | Attending: Family Medicine | Admitting: Family Medicine

## 2021-12-07 DIAGNOSIS — Z1231 Encounter for screening mammogram for malignant neoplasm of breast: Secondary | ICD-10-CM | POA: Diagnosis not present

## 2022-05-23 ENCOUNTER — Ambulatory Visit
Admission: RE | Admit: 2022-05-23 | Discharge: 2022-05-23 | Disposition: A | Discharge status: Home / Self Care | Payer: BC Managed Care – PPO | Source: Ambulatory Visit | Attending: Emergency Medicine | Admitting: Emergency Medicine

## 2022-05-23 VITALS — BP 117/75 | HR 73 | Temp 98.3°F | Ht 66.0 in | Wt 130.0 lb

## 2022-05-23 DIAGNOSIS — H60392 Other infective otitis externa, left ear: Secondary | ICD-10-CM

## 2022-05-23 DIAGNOSIS — K1120 Sialoadenitis, unspecified: Secondary | ICD-10-CM | POA: Diagnosis not present

## 2022-05-23 MED ORDER — AMOXICILLIN-POT CLAVULANATE 875-125 MG PO TABS: 1.0000 | ORAL_TABLET | Freq: Two times a day (BID) | ORAL | 0 refills | Status: AC

## 2022-05-23 NOTE — ED Provider Notes (Signed)
[facility]    CSN: [id number] Arrival date & time: 05/23/22  1846      History   Chief Complaint Chief Complaint  Patient presents with   Otalgia    LT ear     HPI Anna Hammond is a 61 y.o. female.   HPI  61 year old female here for evaluation of multiple complaints.  Patient reports that she has had several rounds of salivary stones and salivary gland infections over the past year the most recent being approximately 4 weeks ago.  She states that she was able to massage the gland and express the stone as well as some pus from the gland.  She is concerned however because she continues to express pus from the salivary gland on the left.  Additionally, she is now having pain in her left ear.  She denies any fever, facial swelling, or drainage from her ear.  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   Constipation 10/13/2021   Dribbling following urination 10/13/2021   Atypical chest pain 10/01/2019   Polyp of transverse colon    Polyp of sigmoid colon     Past Surgical History:  Procedure Laterality Date   ADENOIDECTOMY     BREAST BIOPSY Left    cyst removed   COLONOSCOPY WITH PROPOFOL N/A 09/22/2019   Procedure: COLONOSCOPY WITH BIOPSY;  Surgeon: Lucilla Lame, MD;  Location: [place];  Service: Endoscopy;  Laterality: N/A;  priority 4   POLYPECTOMY N/A 09/22/2019   Procedure: POLYPECTOMY;  Surgeon: Lucilla Lame, MD;  Location: [place];  Service: Endoscopy;  Laterality: N/A;   SHOULDER ARTHROSCOPY Right     OB History      Gravida  4   Para  4   Term      Preterm      AB      Living         SAB      IAB      Ectopic      Multiple      Live Births               Home Medications    Prior to Admission medications   Medication Sig Start Date End Date Taking? Authorizing Provider  amoxicillin-clavulanate (AUGMENTIN) 875-125 MG tablet Take 1 tablet by mouth every 12 (twelve) hours for 10 days. 05/23/22 06/02/22 Yes Margarette Canada, NP  Ascorbic Acid (VITAMIN C PO) Take by mouth.    [provider]    Family History Family History  Problem Relation Age of Onset   Cancer Mother        uterian cancer   Lung cancer Father        smoker   Other Daughter        brain cyst   Breast cancer Paternal Grandmother    Lung cancer Paternal Grandfather        smoker    Social History Social History   Tobacco Use  Smoking status: Never   Smokeless tobacco: Never  Vaping Use   Vaping Use: Never used  Substance Use Topics   Alcohol use: Yes    Comment: once a month   Drug use: Never     Allergies   Equal [aspartame], Macrobid [nitrofurantoin macrocrystal], and Nitrofurantoin   Review of Systems Review of Systems  Constitutional:  Negative for fever.  HENT:  Positive for ear pain. Negative for ear discharge and mouth sores.      Physical Exam Triage Vital Signs ED Triage Vitals  Enc Vitals Group     BP 05/23/22 1909 117/75     Pulse Rate 05/23/22 1909 73     Resp --      Temp 05/23/22 1909 98.3 F (36.8 C)     Temp Source 05/23/22 1909 Oral     SpO2 05/23/22 1909 98 %     Weight 05/23/22 1908 130 lb (59 kg)     Height 05/23/22 1908 '5\' 6"'$  (1.676 m)     Head Circumference --      Peak Flow --      Pain Score 05/23/22 1908 4     Pain Loc --      Pain Edu? --      Excl. in [place]? --    No data found.  Updated Vital Signs BP 117/75 (BP Location: Left Arm)   Pulse 73   Temp 98.3 F (36.8 C) (Oral)   Ht '5\' 6"'$  (1.676 m)   Wt 130 lb (59 kg)   SpO2 98%   BMI  20.98 kg/m   Visual Acuity Right Eye Distance:   Left Eye Distance:   Bilateral Distance:    Right Eye Near:   Left Eye Near:    Bilateral Near:     Physical Exam Vitals and nursing note reviewed.  Constitutional:      Appearance: Normal appearance. She is not ill-appearing.  HENT:     Head: Normocephalic and atraumatic.     Right Ear: Tympanic membrane, ear canal and external ear normal. There is no impacted cerumen.     Left Ear: Tympanic membrane and external ear normal.     Ears:     Comments: Patient has significant swelling to the left external auditory canal.  There is also mild erythema to the canal.  The tympanic membrane is clearly visible and is pearly gray in appearance.    Mouth/Throat:     Mouth: Mucous membranes are moist.     Pharynx: Oropharynx is clear. No oropharyngeal exudate or posterior oropharyngeal erythema.  Skin:    General: Skin is warm and dry.     Capillary Refill: Capillary refill takes less than 2 seconds.     Findings: No erythema.  Neurological:     General: No focal deficit present.     Mental Status: She is alert and oriented to person, place, and time.  Psychiatric:        Mood and Affect: Mood normal.        Behavior: Behavior normal.        Thought Content: Thought content normal.        Judgment: Judgment normal.      UC Treatments / Results  Labs (all labs ordered are listed, but only abnormal results are displayed) Labs Reviewed - No data to display  EKG   Radiology No results found.  Procedures Procedures (including critical care time)  Medications Ordered in UC Medications - No data to display  Initial Impression / Assessment and Plan / UC Course  I have reviewed the triage vital signs and the nursing notes.  Pertinent labs & imaging results that were available during my care of the patient were reviewed by me and considered in my medical decision making (see chart for details).   Patient is a nontoxic-appearing  61 year old female here for evaluation of possible salivary gland infection as well as left ear pain as outlined HPI above.  On exam patient has erythema and edema of the external auditory canal on the left.  The tympanic membrane is pearly gray in appearance.  Right TM is pearly gray in appearance and external auditory canal is within normal limits.  This is consistent with otitis externa.  Additionally, patient has some tenderness with palpation of the parotid gland on the left.  There is no overlying erythema or edema.  Intraoral exam does not reveal any erythema or pus discharge into the oral cavity.  I will treat the patient for residual salivary gland infection and otitis externa with oral Augmentin twice daily for 10 days.  Tylenol and ibuprofen as needed for discomfort.  Of also suggested that patient continue to suck on sour candies to stimulate saliva production and see if that can help express the remainder the infection from the parotid gland.  Return precautions reviewed.   Final Clinical Impressions(s) / UC Diagnoses   Final diagnoses:  Other infective acute otitis externa of left ear  Salivary gland infection     Discharge Instructions      Take the Augmentin twice daily for 10 days with food for treatment of your ear infection and salivary gland infection.  Suck on sour candy to help stimulate saliva production and flush the remaining infection from the salivary gland.   Take an over-the-counter probiotic 1 hour after each dose of antibiotic to prevent diarrhea.  Use over-the-counter Tylenol and ibuprofen as needed for pain or fever.  Place a hot water bottle, or heating pad, underneath your pillowcase at night to help dilate up your ear and aid in pain relief as well as resolution of the infection.  Return for reevaluation for any new or worsening symptoms.      ED Prescriptions     Medication Sig Dispense Auth. Provider   amoxicillin-clavulanate (AUGMENTIN) 875-125  MG tablet Take 1 tablet by mouth every 12 (twelve) hours for 10 days. 20 tablet Margarette Canada, NP      PDMP not reviewed this encounter.   Margarette Canada, NP 05/23/22 1950

## 2022-05-23 NOTE — Discharge Instructions (Signed)
Take the Augmentin twice daily for 10 days with food for treatment of your ear infection and salivary gland infection.  Suck on sour candy to help stimulate saliva production and flush the remaining infection from the salivary gland.   Take an over-the-counter probiotic 1 hour after each dose of antibiotic to prevent diarrhea.  Use over-the-counter Tylenol and ibuprofen as needed for pain or fever.  Place a hot water bottle, or heating pad, underneath your pillowcase at night to help dilate up your ear and aid in pain relief as well as resolution of the infection.  Return for reevaluation for any new or worsening symptoms.

## 2022-05-23 NOTE — ED Triage Notes (Signed)
Pt states she was seen x1 year ago andf had a stone in salivary gland. Pt states she had another one x4 weeks ago, pushed a lot of pus, having pain in LT ear

## 2022-06-11 ENCOUNTER — Other Ambulatory Visit: Payer: Self-pay | Admitting: Otolaryngology

## 2022-06-11 DIAGNOSIS — H9202 Otalgia, left ear: Secondary | ICD-10-CM

## 2022-07-05 ENCOUNTER — Other Ambulatory Visit: Payer: BC Managed Care – PPO

## 2022-08-07 ENCOUNTER — Ambulatory Visit
Admission: RE | Admit: 2022-08-07 | Discharge: 2022-08-07 | Disposition: A | Discharge status: Home / Self Care | Payer: BC Managed Care – PPO | Source: Ambulatory Visit | Attending: Otolaryngology | Admitting: Otolaryngology

## 2022-08-07 DIAGNOSIS — H9202 Otalgia, left ear: Secondary | ICD-10-CM

## 2022-08-07 MED ORDER — IOPAMIDOL (ISOVUE-300) INJECTION 61%
75.0000 mL | Freq: Once | INTRAVENOUS | Status: AC | PRN
  Administered 2022-08-07: 75 mL via INTRAVENOUS

## 2022-09-04 ENCOUNTER — Encounter: Payer: Self-pay | Admitting: Family Medicine

## 2022-09-04 ENCOUNTER — Ambulatory Visit: Payer: BC Managed Care – PPO | Admitting: Family Medicine

## 2022-09-04 VITALS — BP 107/71 | HR 69 | Temp 97.8°F | Resp 16 | Wt 142.5 lb

## 2022-09-04 DIAGNOSIS — H66001 Acute suppurative otitis media without spontaneous rupture of ear drum, right ear: Secondary | ICD-10-CM

## 2022-09-04 DIAGNOSIS — R590 Localized enlarged lymph nodes: Secondary | ICD-10-CM

## 2022-09-04 MED ORDER — AMOXICILLIN-POT CLAVULANATE 875-125 MG PO TABS: 1.0000 | ORAL_TABLET | Freq: Two times a day (BID) | ORAL | 0 refills | Status: AC

## 2022-09-04 NOTE — Progress Notes (Signed)
I,Sulibeya S Dimas,acting as a Education administrator for Lavon Paganini, MD.,have documented all relevant documentation on the behalf of Lavon Paganini, MD,as directed by  Lavon Paganini, MD while in the presence of Lavon Paganini, MD.     Established patient visit   Patient: Anna Hammond   DOB: Jul 14, 1961   62 y.o. Female  MRN: [id number] Visit Date: 09/04/2022  Today's healthcare provider: Lavon Paganini, MD   Chief Complaint  Patient presents with   Ear Fullness   Subjective    HPI  Patient C/O swelling behind right ear since Sunday. She reports pressure in right ear started today. She denies any cold symptoms or discharge from her ear.   No other symptoms or fever.  Medications: Outpatient Medications Prior to Visit  Medication Sig   Ascorbic Acid (VITAMIN C PO) Take by mouth.   fluticasone (FLONASE) 50 MCG/ACT nasal spray Place 1 spray into both nostrils as needed for allergies or rhinitis.   No facility-administered medications prior to visit.    Review of Systems  Constitutional:  Negative for chills and fever.  HENT:  Positive for ear pain. Negative for congestion, ear discharge, rhinorrhea, sinus pressure, sinus pain and sore throat.   Respiratory:  Negative for cough and shortness of breath.        Objective    BP 107/71 (BP Location: Left Arm, Patient Position: Sitting, Cuff Size: Normal)   Pulse 69   Temp 97.8 F (36.6 C) (Temporal)   Resp 16   Wt 142 lb 8 oz (64.6 kg)   BMI 23.00 kg/m    Physical Exam Vitals reviewed.  Constitutional:      General: She is not in acute distress.    Appearance: She is well-developed.  HENT:     Head: Normocephalic and atraumatic.     Right Ear: Ear canal and external ear normal.     Left Ear: Tympanic membrane, ear canal and external ear normal.     Ears:     Comments: Erythematous R TM    Nose: Nose normal.     Mouth/Throat:     Mouth: Mucous membranes are moist.     Pharynx: Oropharynx is clear.   Eyes:     General: No scleral icterus.    Conjunctiva/sclera: Conjunctivae normal.  Cardiovascular:     Rate and Rhythm: Normal rate and regular rhythm.  Pulmonary:     Effort: Pulmonary effort is normal. No respiratory distress.  Skin:    General: Skin is warm and dry.     Findings: No rash.  Neurological:     Mental Status: She is alert and oriented to person, place, and time.  Psychiatric:        Behavior: Behavior normal.       No results found for any visits on 09/04/22.  Assessment & Plan     1. Non-recurrent acute suppurative otitis media of right ear without spontaneous rupture of tympanic membrane 2. Cervical lymphadenopathy - symptoms and exam c/w R AOM without rupture - associated LAD with TTP - no evidence of sinusitis, CAP, strep pharyngitis, or other infection - will treat with Augmentin x7d - discussed symptomatic management (flonase, tylenol, etc), natural course, and return precautions       Meds ordered this encounter  Medications   amoxicillin-clavulanate (AUGMENTIN) 875-125 MG tablet    Sig: Take 1 tablet by mouth 2 (two) times daily for 7 days.    Dispense:  14 tablet    Refill:  0  Return if symptoms worsen or fail to improve.      I, Lavon Paganini, MD, have reviewed all documentation for this visit. The documentation on 09/04/22 for the exam, diagnosis, procedures, and orders are all accurate and complete.   Breaunna Gottlieb, Dionne Bucy, MD, MPH [facility]

## 2022-11-12 ENCOUNTER — Encounter: Payer: BC Managed Care – PPO | Admitting: Family Medicine

## 2022-11-26 ENCOUNTER — Encounter: Payer: Self-pay | Admitting: Family Medicine

## 2022-11-26 ENCOUNTER — Ambulatory Visit (INDEPENDENT_AMBULATORY_CARE_PROVIDER_SITE_OTHER): Payer: BC Managed Care – PPO | Admitting: Family Medicine

## 2022-11-26 VITALS — BP 112/76 | HR 62 | Temp 97.6°F | Resp 12 | Ht 66.0 in | Wt 141.8 lb

## 2022-11-26 DIAGNOSIS — Z1231 Encounter for screening mammogram for malignant neoplasm of breast: Secondary | ICD-10-CM

## 2022-11-26 DIAGNOSIS — E782 Mixed hyperlipidemia: Secondary | ICD-10-CM

## 2022-11-26 DIAGNOSIS — Z0000 Encounter for general adult medical examination without abnormal findings: Secondary | ICD-10-CM | POA: Diagnosis not present

## 2022-11-26 NOTE — Progress Notes (Signed)
Complete physical exam  Patient: Anna Hammond   DOB: 04-29-1961   62 y.o. Female  MRN: [id number]  Subjective:    Chief Complaint  Patient presents with   Annual Exam    Anna Hammond is a 62 y.o. female who presents today for a complete physical exam. She reports consuming a general diet.  Exercise includes yard work and physical activities around the house   She generally feels well. She reports sleeping well. She does not have additional problems to discuss today.   "Over-did it" while mowing the lawn two weeks ago and has had pain in her right upper gluteus to her lateral hip since then.  Managing pain with RICE and alternating ibuprofen and tylenol. Is pretty certain she just pulled a muscle.    Most recent fall risk assessment:    11/26/2022    3:54 PM  Fall Risk   Falls in the past year? 0  Number falls in past yr: 0  Injury with Fall? 0  Risk for fall due to : No Fall Risks  Follow up Falls evaluation completed     Most recent depression screenings:    11/26/2022    3:54 PM 09/04/2022    4:06 PM  PHQ 2/9 Scores  PHQ - 2 Score 0 0  PHQ- 9 Score 2 2        Patient Care Team: Erasmo Downer, MD as PCP - General (Family Medicine) Nahser, Deloris Ping, MD as PCP - Cardiology (Cardiology)   Outpatient Medications Prior to Visit  Medication Sig   Ascorbic Acid (VITAMIN C PO) Take by mouth.   fluticasone (FLONASE) 50 MCG/ACT nasal spray Place 1 spray into both nostrils as needed for allergies or rhinitis.   No facility-administered medications prior to visit.    Review of Systems  Constitutional:  Negative for chills and fever.  Respiratory:  Negative for cough and shortness of breath.   Cardiovascular:  Negative for chest pain.  Gastrointestinal: Negative.   Genitourinary: Negative.   Musculoskeletal:  Positive for back pain.          Objective:     BP 112/76 (BP Location: Left Arm, Patient Position: Sitting, Cuff Size: Large)   Pulse 62    Temp 97.6 F (36.4 C) (Temporal)   Resp 12   Ht 5\' 6"  (1.676 m)   Wt 141 lb 12.8 oz (64.3 kg)   BMI 22.89 kg/m    Physical Exam Constitutional:      General: She is not in acute distress. HENT:     Head: Normocephalic and atraumatic.  Eyes:     Conjunctiva/sclera: Conjunctivae normal.  Cardiovascular:     Pulses: Normal pulses.     Heart sounds: Normal heart sounds.  Pulmonary:     Effort: Pulmonary effort is normal.     Breath sounds: Normal breath sounds.  Musculoskeletal:        General: No swelling, tenderness or deformity. Normal range of motion.  Skin:    General: Skin is warm and dry.  Neurological:     Mental Status: She is alert.      No results found for any visits on 11/26/22.     Assessment & Plan:    Routine Health Maintenance and Physical Exam  Immunization History  Administered Date(s) Administered   Influenza, Quadrivalent, Recombinant, Inj, Pf 06/23/2019   Influenza-Unspecified 06/23/2019   Measles 08/26/1987   Td 11/24/2010, 05/26/2018   Tdap 11/24/2010    Health Maintenance  Topic  Date Due   COVID-19 Vaccine (1) Never done   Zoster Vaccines- Shingrix (1 of 2) Never done   MAMMOGRAM  12/08/2023   COLONOSCOPY (Pts 45-68yrs Insurance coverage will need to be confirmed)  09/21/2024   PAP SMEAR-Modifier  11/10/2026   DTaP/Tdap/Td (4 - Td or Tdap) 05/26/2028   Hepatitis C Screening  Completed   HIV Screening  Completed   HPV VACCINES  Aged Out   INFLUENZA VACCINE  Discontinued    Discussed health benefits of physical activity, and encouraged her to engage in regular exercise appropriate for her age and condition.  Problem List Items Addressed This Visit   1. Encounter for annual physical exam Annual physical exam completed at this visit.  Shingles shot declined at this time, wants to get live vaccine. Ordered CMP and FLP. - Comprehensive metabolic panel - Lipid panel  2. Breast cancer screening by mammogram Mammogram next due in  May.  Ordered at this visit.  - MM 3D SCREENING MAMMOGRAM BILATERAL BREAST; Future  3. Moderate mixed hyperlipidemia not requiring statin therapy Currently managed with lifestyle interventions. Ordered CMP and FLP.  - Comprehensive metabolic panel - Lipid panel   Return in about 1 year (around 11/26/2023) for CPE.     Gilmer Mor, Medical Student   Patient seen along with MS3 student Ezekiel Slocumb. I personally evaluated this patient along with the student, and verified all aspects of the history, physical exam, and medical decision making as documented by the student. I agree with the student's documentation and have made all necessary edits.  Oleg Oleson, Marzella Schlein, MD, MPH [facility]

## 2023-01-10 LAB — LIPID PANEL
Chol/HDL Ratio: 2.5 ratio (ref 0.0–4.4)
Cholesterol, Total: 182 mg/dL (ref 100–199)
HDL: 74 mg/dL (ref >39)
LDL Chol Calc (NIH): 94 mg/dL (ref 0–99)
Triglycerides: 78 mg/dL (ref 0–149)
VLDL Cholesterol Cal: 14 mg/dL (ref 5–40)

## 2023-01-10 LAB — COMPREHENSIVE METABOLIC PANEL
ALT: 13 IU/L (ref 0–32)
AST: 19 IU/L (ref 0–40)
Albumin/Globulin Ratio: 2.1
Albumin: 4.5 g/dL (ref 3.9–4.9)
Alkaline Phosphatase: 61 IU/L (ref 44–121)
BUN/Creatinine Ratio: 21 (ref 12–28)
BUN: 14 mg/dL (ref 8–27)
Bilirubin Total: 1.3 mg/dL — ABNORMAL HIGH (ref 0.0–1.2)
CO2: 27 mmol/L (ref 20–29)
Calcium: 9.9 mg/dL (ref 8.7–10.3)
Chloride: 104 mmol/L (ref 96–106)
Creatinine, Ser: 0.68 mg/dL (ref 0.57–1.00)
Globulin, Total: 2.1 g/dL (ref 1.5–4.5)
Glucose: 83 mg/dL (ref 70–99)
Potassium: 4.3 mmol/L (ref 3.5–5.2)
Sodium: 143 mmol/L (ref 134–144)
Total Protein: 6.6 g/dL (ref 6.0–8.5)
eGFR: 98 mL/min/{1.73_m2} (ref >59)

## 2023-01-15 NOTE — Progress Notes (Signed)
Please, let pt know that her labs are WNL.

## 2023-01-17 ENCOUNTER — Ambulatory Visit
Admission: RE | Admit: 2023-01-17 | Discharge: 2023-01-17 | Disposition: A | Discharge status: Home / Self Care | Payer: BC Managed Care – PPO | Source: Ambulatory Visit | Attending: Family Medicine | Admitting: Family Medicine

## 2023-01-17 DIAGNOSIS — Z1231 Encounter for screening mammogram for malignant neoplasm of breast: Secondary | ICD-10-CM | POA: Diagnosis not present

## 2023-03-15 IMAGING — MG MM DIGITAL SCREENING BILAT W/ TOMO AND CAD
8 series · 8 of 24 positions shown · non-contrast
Comparison: Previous exam(s).

CLINICAL DATA: Screening.

EXAM:
DIGITAL SCREENING BILATERAL MAMMOGRAM WITH TOMOSYNTHESIS AND CAD
TECHNIQUE: Bilateral screening digital craniocaudal and mediolateral oblique
mammograms were obtained. Bilateral screening digital breast
tomosynthesis was performed. The images were evaluated with
computer-aided detection.

[L CC synth-2D]
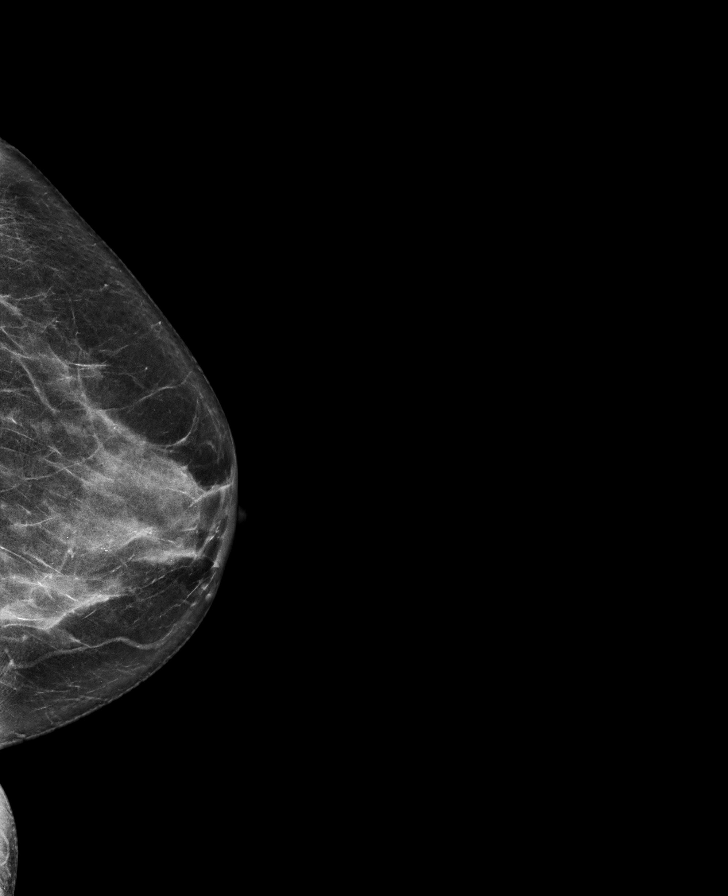

[L MLO synth-2D]
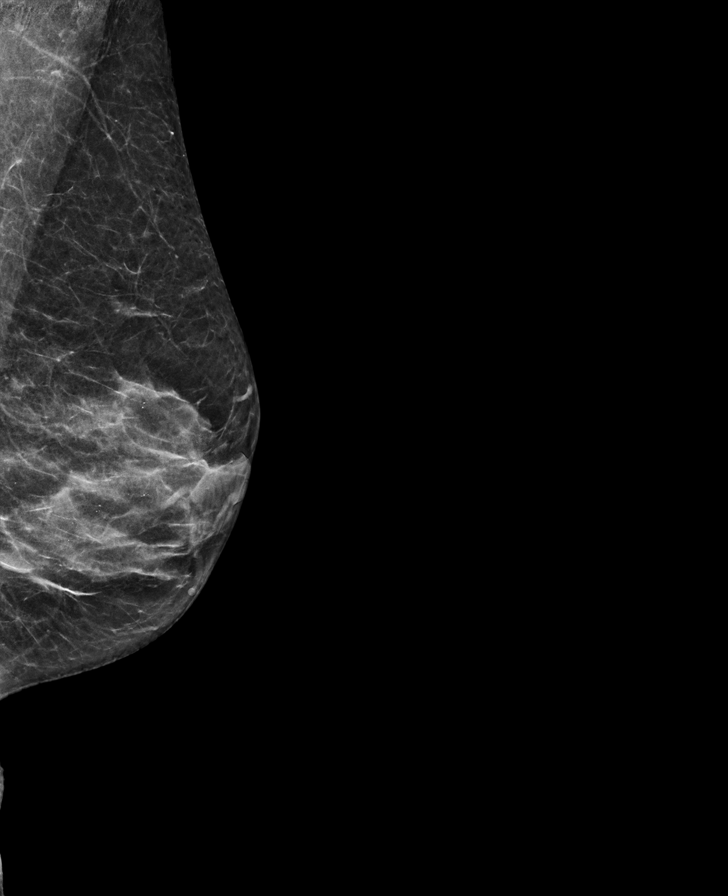

[R CC synth-2D]
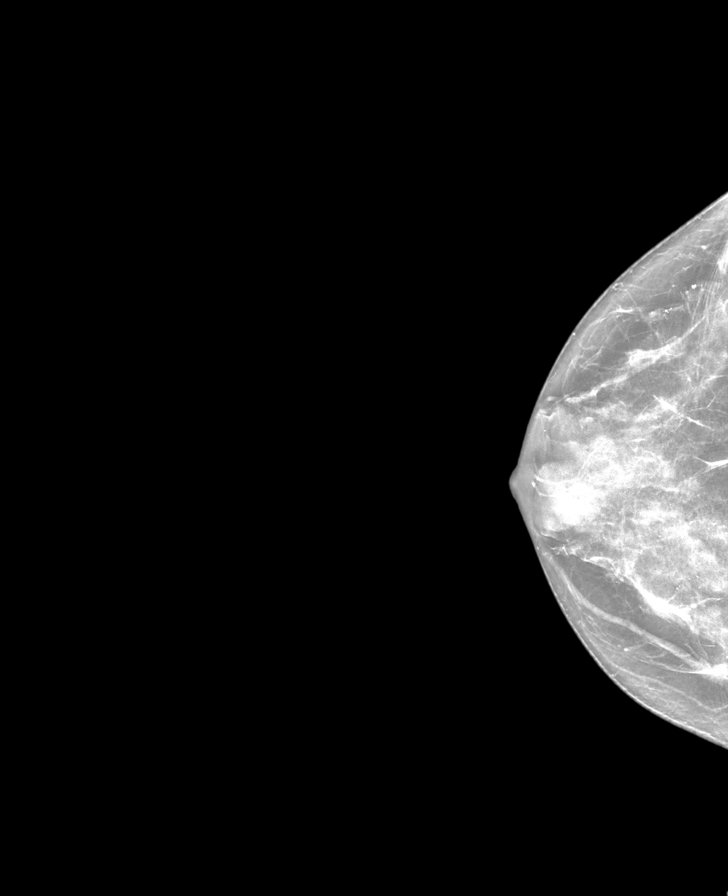

[R MLO synth-2D]
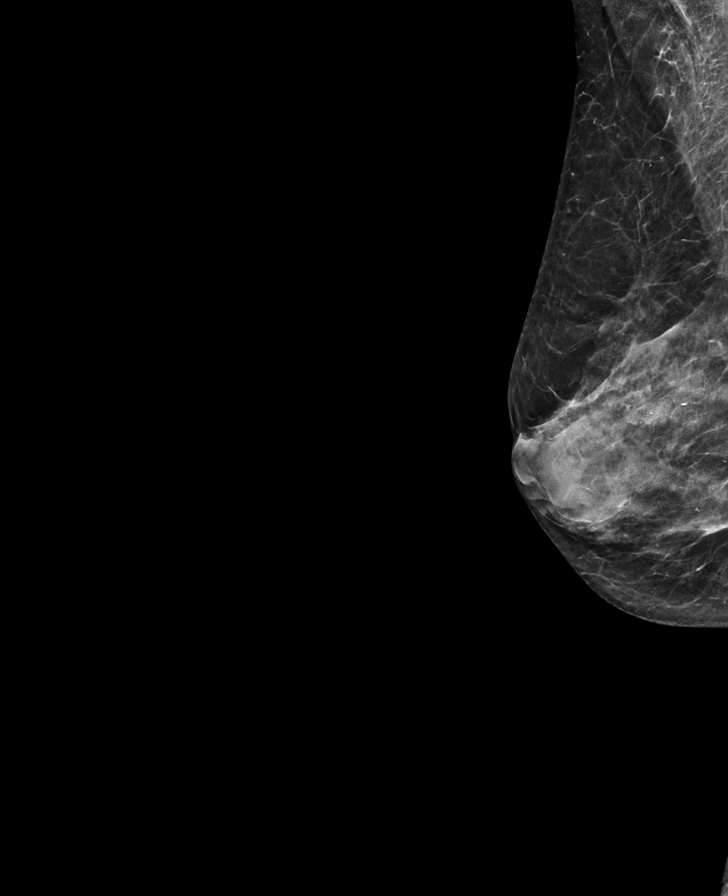

[L CC tomo · tomo slice 33/65.0]
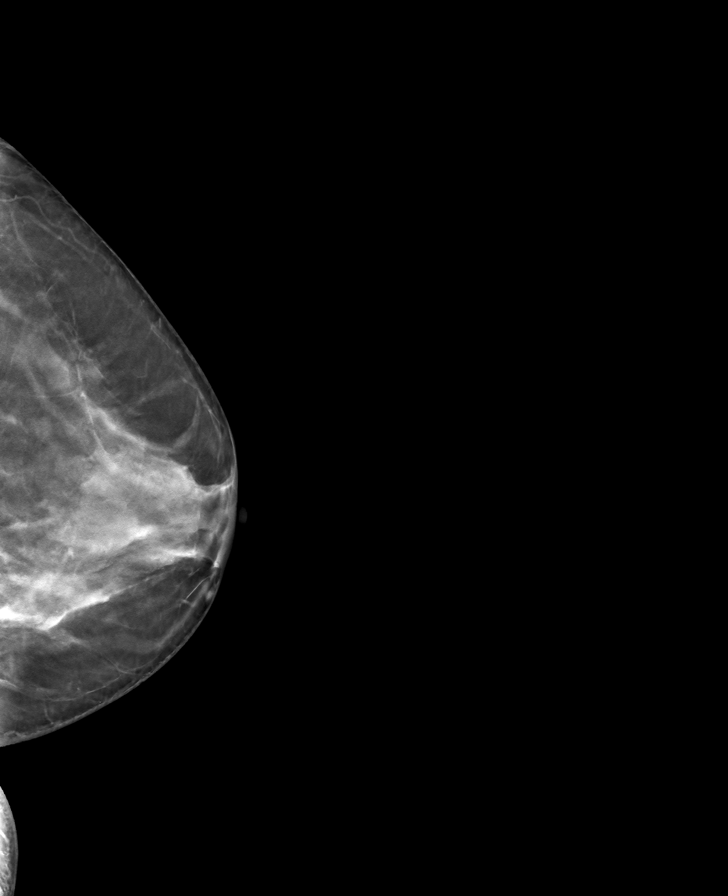

[R MLO tomo · tomo slice 29/56.0]
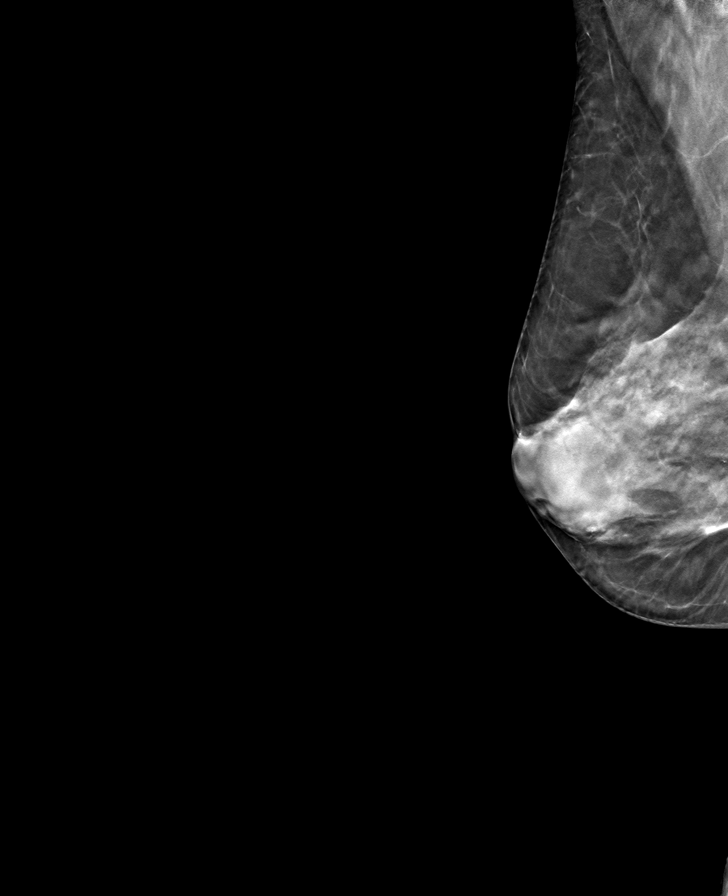

[L MLO tomo · tomo slice 29/58.0]
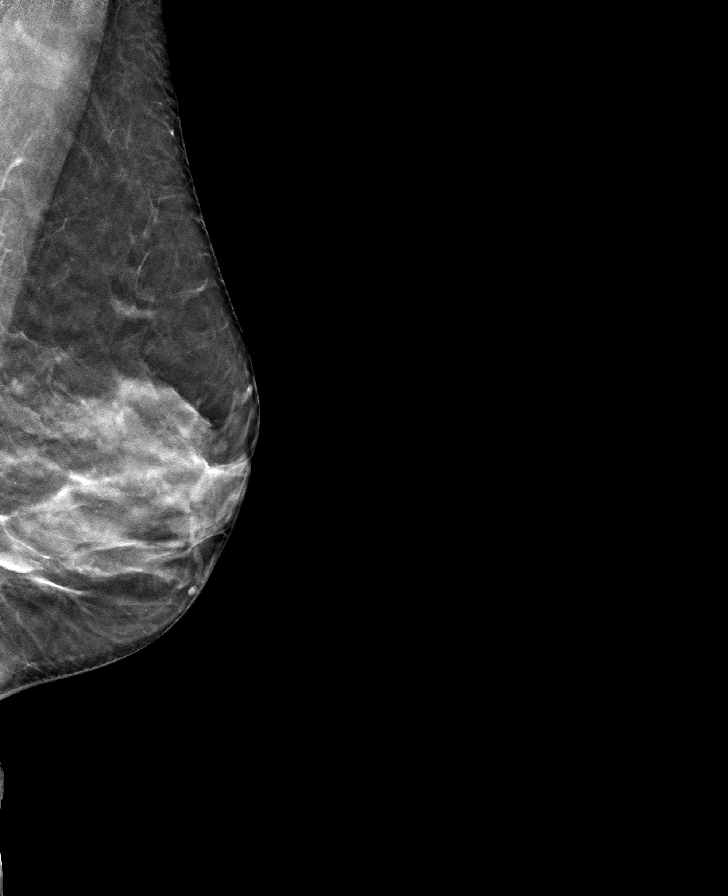

[R CC tomo · tomo slice 31/61.0]
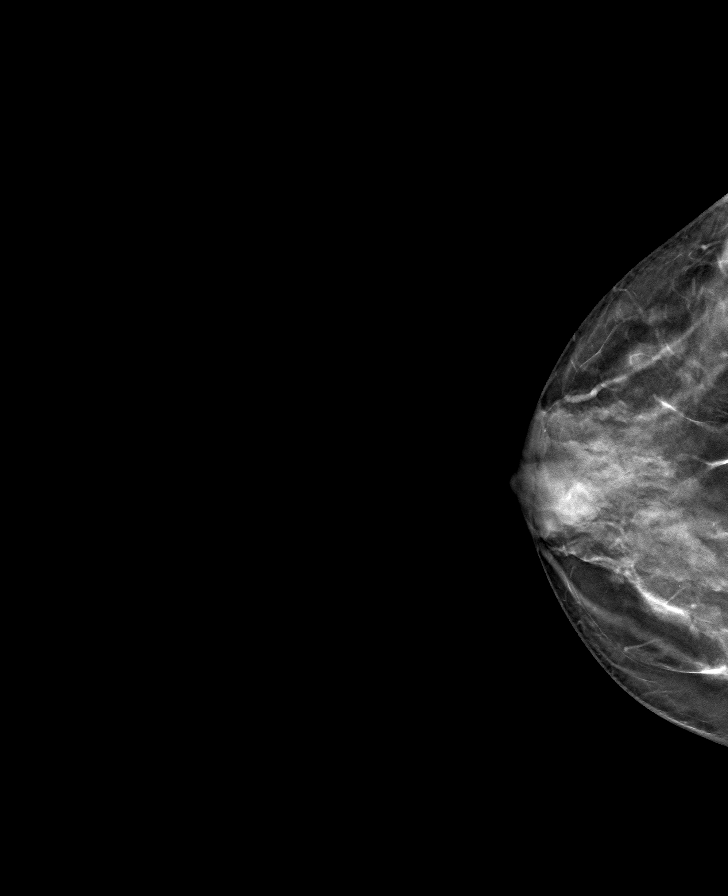

[8 of 24 positions shown; findings below may reference images not displayed]

ACR Breast Density Category d: The breast tissue is extremely dense,
which lowers the sensitivity of mammography
FINDINGS: There are no findings suspicious for malignancy.
IMPRESSION: No mammographic evidence of malignancy. A result letter of this
screening mammogram will be mailed directly to the patient.

RECOMMENDATION:
Screening mammogram in one year. (Code:TA-V-WV9)

BI-RADS CATEGORY  1: Negative.

## 2024-02-19 ENCOUNTER — Telehealth: Payer: Self-pay

## 2024-02-19 DIAGNOSIS — Z0000 Encounter for general adult medical examination without abnormal findings: Secondary | ICD-10-CM

## 2024-02-19 DIAGNOSIS — E782 Mixed hyperlipidemia: Secondary | ICD-10-CM

## 2024-02-20 NOTE — Telephone Encounter (Signed)
 Spoke with patient, advised that orders were placed. Advised of timeline and lab hours

## 2024-02-20 NOTE — Telephone Encounter (Signed)
 Ok to get CMP and lipid panel for annual physical prior to appt

## 2024-03-12 LAB — COMPREHENSIVE METABOLIC PANEL WITH GFR
ALT: 14 IU/L (ref 0–32)
AST: 22 IU/L (ref 0–40)
Albumin: 4.3 g/dL (ref 3.9–4.9)
Alkaline Phosphatase: 60 IU/L (ref 44–121)
BUN/Creatinine Ratio: 19 (ref 12–28)
BUN: 13 mg/dL (ref 8–27)
Bilirubin Total: 1.4 mg/dL — ABNORMAL HIGH (ref 0.0–1.2)
CO2: 23 mmol/L (ref 20–29)
Calcium: 9.3 mg/dL (ref 8.7–10.3)
Chloride: 106 mmol/L (ref 96–106)
Creatinine, Ser: 0.68 mg/dL (ref 0.57–1.00)
Globulin, Total: 1.9 g/dL (ref 1.5–4.5)
Glucose: 85 mg/dL (ref 70–99)
Potassium: 4.5 mmol/L (ref 3.5–5.2)
Sodium: 143 mmol/L (ref 134–144)
Total Protein: 6.2 g/dL (ref 6.0–8.5)
eGFR: 98 mL/min/1.73 (ref >59)

## 2024-03-12 LAB — LIPID PANEL
Chol/HDL Ratio: 2.4 ratio (ref 0.0–4.4)
Cholesterol, Total: 181 mg/dL (ref 100–199)
HDL: 75 mg/dL (ref >39)
LDL Chol Calc (NIH): 96 mg/dL (ref 0–99)
Triglycerides: 53 mg/dL (ref 0–149)
VLDL Cholesterol Cal: 10 mg/dL (ref 5–40)

## 2024-03-13 ENCOUNTER — Other Ambulatory Visit: Payer: Self-pay | Admitting: Medical Genetics

## 2024-03-16 ENCOUNTER — Ambulatory Visit: Payer: Self-pay | Admitting: Family Medicine

## 2024-03-19 ENCOUNTER — Encounter: Payer: Self-pay | Admitting: Family Medicine

## 2024-03-19 ENCOUNTER — Ambulatory Visit (INDEPENDENT_AMBULATORY_CARE_PROVIDER_SITE_OTHER): Admitting: Family Medicine

## 2024-03-19 VITALS — BP 108/60 | HR 65 | Ht 66.0 in | Wt 129.0 lb

## 2024-03-19 DIAGNOSIS — Z0000 Encounter for general adult medical examination without abnormal findings: Secondary | ICD-10-CM

## 2024-03-19 DIAGNOSIS — E782 Mixed hyperlipidemia: Secondary | ICD-10-CM

## 2024-03-19 DIAGNOSIS — Z1231 Encounter for screening mammogram for malignant neoplasm of breast: Secondary | ICD-10-CM

## 2024-03-19 NOTE — Progress Notes (Signed)
 Complete physical exam  Patient: Anna Hammond   DOB: [birth year]   63 y.o. Female  MRN: [id number]  Subjective:    Chief Complaint  Patient presents with   Care Management    Pattern of eating: general   Are you exercising: yes  What type of exercising:walking  How long: 8,000 step walking at work   How frequent: 5 days   Vaccine: Denied all vaccines        Anna Hammond is a 63 y.o. female who presents today for a complete physical exam. She reports consuming a general diet. Home exercise routine includes walking .5 hrs per day. She generally feels well. She reports sleeping well. She does not have additional problems to discuss today.    Most recent fall risk assessment:    11/26/2022    3:54 PM  Fall Risk   Falls in the past year? 0  Number falls in past yr: 0  Injury with Fall? 0  Risk for fall due to : No Fall Risks  Follow up Falls evaluation completed     Most recent depression screenings:    03/19/2024    9:20 AM 11/26/2022    3:54 PM  PHQ 2/9 Scores  PHQ - 2 Score 0 0  PHQ- 9 Score 0 2        Patient Care Team: Myrla Jon HERO, MD as PCP - General (Family Medicine) Nahser, Aleene PARAS, MD (Inactive) as PCP - Cardiology (Cardiology)   Outpatient Medications Prior to Visit  Medication Sig   Ascorbic Acid (VITAMIN C PO) Take by mouth.   fluticasone (FLONASE) 50 MCG/ACT nasal spray Place 1 spray into both nostrils as needed for allergies or rhinitis.   No facility-administered medications prior to visit.    ROS        Objective:     BP 108/60   Pulse 65   Ht 5' 6 (1.676 m)   Wt 129 lb (58.5 kg)   SpO2 100%   BMI 20.82 kg/m    Physical Exam Vitals reviewed.  Constitutional:      General: She is not in acute distress.    Appearance: Normal appearance. She is not ill-appearing or diaphoretic.  HENT:     Head: Normocephalic.     Right Ear: Tympanic membrane, ear canal and external ear normal.     Left Ear: Tympanic  membrane, ear canal and external ear normal.     Nose: Nose normal.     Mouth/Throat:     Mouth: Mucous membranes are moist.     Pharynx: Oropharynx is clear. No oropharyngeal exudate or posterior oropharyngeal erythema.  Eyes:     General: No scleral icterus.    Conjunctiva/sclera: Conjunctivae normal.     Pupils: Pupils are equal, round, and reactive to light.  Cardiovascular:     Rate and Rhythm: Normal rate and regular rhythm.     Pulses: Normal pulses.     Heart sounds: Normal heart sounds. No murmur heard.    No friction rub. No gallop.  Pulmonary:     Effort: Pulmonary effort is normal. No respiratory distress.     Breath sounds: Normal breath sounds. No wheezing.  Abdominal:     General: There is no distension.     Palpations: Abdomen is soft.     Tenderness: There is no abdominal tenderness. There is no guarding.  Musculoskeletal:     Cervical back: Normal range of motion.     Right lower leg: No  edema.     Left lower leg: No edema.  Lymphadenopathy:     Cervical: No cervical adenopathy.  Skin:    General: Skin is warm and dry.     Capillary Refill: Capillary refill takes less than 2 seconds.  Neurological:     Mental Status: She is alert and oriented to person, place, and time.  Psychiatric:        Mood and Affect: Mood normal.      No results found for any visits on 03/19/24.     Assessment & Plan:    Routine Health Maintenance and Physical Exam  Immunization History  Administered Date(s) Administered   Influenza, Quadrivalent, Recombinant, Inj, Pf 06/23/2019   Influenza-Unspecified 06/23/2019   Measles 08/26/1987   Td 11/24/2010, 05/26/2018   Tdap 11/24/2010    Health Maintenance  Topic Date Due   Pneumococcal Vaccine: 50+ Years (1 of 1 - PCV) Never done   Zoster Vaccines- Shingrix (1 of 2) Never done   COVID-19 Vaccine (1 - 2024-25 season) Never done   Colonoscopy  09/21/2024   MAMMOGRAM  01/16/2025   Cervical Cancer Screening (HPV/Pap Cotest)   11/10/2026   DTaP/Tdap/Td (4 - Td or Tdap) 05/26/2028   Hepatitis C Screening  Completed   HIV Screening  Completed   Hepatitis B Vaccines 19-59 Average Risk  Aged Out   HPV VACCINES  Aged Out   Meningococcal B Vaccine  Aged Out   INFLUENZA VACCINE  Discontinued    Discussed health benefits of physical activity, and encouraged her to engage in regular exercise appropriate for her age and condition.  Problem List Items Addressed This Visit   None Visit Diagnoses       Encounter for annual physical exam    -  Primary     Moderate mixed hyperlipidemia not requiring statin therapy         Breast cancer screening by mammogram       Relevant Orders   MM 3D SCREENING MAMMOGRAM BILATERAL BREAST      Moderate mixed hyperlipidemia not requiring statin therapy History of mildly elevated cholesterol and LDL current well controlled with lifestyle modifications. Most recent FLP and CMP stable.  - Continue lifestyle modifications - Order FLP (resulted wnl) - Order CMP (resulted wnl)  General Health Maintenance Discussed wellness and preventative health screenings to achieve health maintenance goals. - Encouraged flu and Covid vaccinations in the fall; patient declined - Schedule mammogram in the coming weeks - Amb ref for mammogram - Encouraged shingles and pneumonia vaccinations; patient declined - Up to date on other wellness measures and preventative screenings  Return in about 1 year (around 03/19/2025) for CPE.     Elia LULLA Blanch, Medical Student   Patient seen along with MS3 student, Elia Blanch. I personally evaluated this patient along with the student, and verified all aspects of the history, physical exam, and medical decision making as documented by the student. I agree with the student's documentation and have made all necessary edits.  Soren Pigman, Jon HERO, MD, MPH [facility]

## 2024-03-19 NOTE — Patient Instructions (Signed)
 Call [facility] to schedule a mammogram [phone]

## 2024-04-07 ENCOUNTER — Other Ambulatory Visit

## 2024-04-07 DIAGNOSIS — Z006 Encounter for examination for normal comparison and control in clinical research program: Secondary | ICD-10-CM

## 2024-04-17 LAB — GENECONNECT MOLECULAR SCREEN: Genetic Analysis Overall Interpretation: NEGATIVE

## 2024-05-06 ENCOUNTER — Ambulatory Visit
Admission: RE | Admit: 2024-05-06 | Discharge: 2024-05-06 | Disposition: A | Discharge status: Home / Self Care | Source: Ambulatory Visit | Attending: Family Medicine | Admitting: Family Medicine

## 2024-05-06 DIAGNOSIS — Z1231 Encounter for screening mammogram for malignant neoplasm of breast: Secondary | ICD-10-CM | POA: Insufficient documentation

## 2024-05-11 ENCOUNTER — Ambulatory Visit: Payer: Self-pay | Admitting: Family Medicine
